# Patient Record
Sex: Male | Born: 1945 | Race: Black or African American | Hispanic: No | Smoking: Never smoker
Health system: Southern US, Community
[De-identification: ages and names within clinical notes are randomized; demographics above are authoritative.]

## PROBLEM LIST (undated history)

## (undated) DIAGNOSIS — I10 Essential (primary) hypertension: Secondary | ICD-10-CM

## (undated) DIAGNOSIS — M5126 Other intervertebral disc displacement, lumbar region: Secondary | ICD-10-CM

## (undated) DIAGNOSIS — E119 Type 2 diabetes mellitus without complications: Secondary | ICD-10-CM

## (undated) DIAGNOSIS — E7800 Pure hypercholesterolemia, unspecified: Secondary | ICD-10-CM

## (undated) DIAGNOSIS — C801 Malignant (primary) neoplasm, unspecified: Secondary | ICD-10-CM

## (undated) HISTORY — PX: COLONOSCOPY: SHX174

## (undated) HISTORY — PX: UPPER GI ENDOSCOPY: SHX6162

## (undated) HISTORY — PX: EYE SURGERY: SHX253

## (undated) HISTORY — PX: LUMBAR LAMINECTOMY: SHX95

## (undated) HISTORY — PX: COLON SURGERY: SHX602

---

## 2012-04-03 ENCOUNTER — Emergency Department: Payer: Self-pay | Admitting: *Deleted

## 2012-04-03 LAB — TROPONIN I: Troponin-I: <0.02 ng/mL

## 2012-04-03 LAB — COMPREHENSIVE METABOLIC PANEL
Albumin: 3.4 g/dL (ref 3.4–5.0)
Alkaline Phosphatase: 64 U/L (ref 50–136)
BUN: 17 mg/dL (ref 7–18)
Bilirubin,Total: 0.6 mg/dL (ref 0.2–1.0)
Creatinine: 1.04 mg/dL (ref 0.60–1.30)
EGFR (Non-African Amer.): >60
Osmolality: 291 (ref 275–301)
Potassium: 3.7 mmol/L (ref 3.5–5.1)
SGPT (ALT): 28 U/L (ref 12–78)
Total Protein: 7 g/dL (ref 6.4–8.2)

## 2012-04-03 LAB — URINALYSIS, COMPLETE
Bacteria: NONE SEEN
Bilirubin,UR: NEGATIVE
Ketone: NEGATIVE
Leukocyte Esterase: NEGATIVE
Ph: 6 (ref 4.5–8.0)
Protein: NEGATIVE
RBC,UR: 1 /HPF (ref 0–5)
Specific Gravity: 1.024 (ref 1.003–1.030)
Squamous Epithelial: NONE SEEN

## 2012-04-03 LAB — CBC
HGB: 14.3 g/dL (ref 13.0–18.0)
Platelet: 147 10*3/uL — ABNORMAL LOW (ref 150–440)
RDW: 13.4 % (ref 11.5–14.5)
WBC: 3.6 10*3/uL — ABNORMAL LOW (ref 3.8–10.6)

## 2012-04-03 LAB — CK TOTAL AND CKMB (NOT AT ARMC): CK, Total: 242 U/L — ABNORMAL HIGH (ref 35–232)

## 2012-04-15 ENCOUNTER — Emergency Department: Payer: Self-pay | Admitting: *Deleted

## 2012-05-06 ENCOUNTER — Emergency Department: Payer: Self-pay | Admitting: *Deleted

## 2012-05-06 LAB — COMPREHENSIVE METABOLIC PANEL
Albumin: 3.6 g/dL (ref 3.4–5.0)
Alkaline Phosphatase: 60 U/L (ref 50–136)
Bilirubin,Total: 0.6 mg/dL (ref 0.2–1.0)
Calcium, Total: 9 mg/dL (ref 8.5–10.1)
Co2: 31 mmol/L (ref 21–32)
EGFR (Non-African Amer.): >60
Osmolality: 279 (ref 275–301)
SGOT(AST): 24 U/L (ref 15–37)
SGPT (ALT): 32 U/L (ref 12–78)
Sodium: 139 mmol/L (ref 136–145)

## 2012-05-06 LAB — URINALYSIS, COMPLETE
Bilirubin,UR: NEGATIVE
Blood: NEGATIVE
Glucose,UR: NEGATIVE mg/dL (ref 0–75)
Nitrite: NEGATIVE
Ph: 5 (ref 4.5–8.0)
Protein: NEGATIVE
RBC,UR: 4 /HPF (ref 0–5)
WBC UR: <1 /HPF (ref 0–5)

## 2012-05-06 LAB — CBC
HGB: 13.9 g/dL (ref 13.0–18.0)
MCHC: 34.7 g/dL (ref 32.0–36.0)
Platelet: 153 10*3/uL (ref 150–440)
RBC: 4.23 10*6/uL — ABNORMAL LOW (ref 4.40–5.90)
WBC: 4.6 10*3/uL (ref 3.8–10.6)

## 2012-05-06 LAB — DRUG SCREEN, URINE
Amphetamines, Ur Screen: NEGATIVE (ref ?–1000)
Cocaine Metabolite,Ur ~~LOC~~: NEGATIVE (ref ?–300)
Methadone, Ur Screen: NEGATIVE (ref ?–300)
Opiate, Ur Screen: POSITIVE (ref ?–300)
Tricyclic, Ur Screen: NEGATIVE (ref ?–1000)

## 2012-05-06 LAB — TROPONIN I: Troponin-I: 0.03 ng/mL

## 2012-05-21 ENCOUNTER — Inpatient Hospital Stay: Payer: Self-pay | Admitting: Internal Medicine

## 2012-05-21 LAB — CBC WITH DIFFERENTIAL/PLATELET
Basophil #: 0 10*3/uL (ref 0.0–0.1)
Eosinophil #: 0.1 10*3/uL (ref 0.0–0.7)
Eosinophil %: 2.4 %
HGB: 14.2 g/dL (ref 13.0–18.0)
Lymphocyte %: 37 %
MCHC: 35.3 g/dL (ref 32.0–36.0)
Monocyte #: 0.4 x10 3/mm (ref 0.2–1.0)
Neutrophil %: 50.7 %
Platelet: 179 10*3/uL (ref 150–440)
WBC: 4.1 10*3/uL (ref 3.8–10.6)

## 2012-05-21 LAB — COMPREHENSIVE METABOLIC PANEL
Albumin: 3.9 g/dL (ref 3.4–5.0)
Anion Gap: 10 (ref 7–16)
Bilirubin,Total: 0.6 mg/dL (ref 0.2–1.0)
Chloride: 107 mmol/L (ref 98–107)
Co2: 25 mmol/L (ref 21–32)
Creatinine: 0.9 mg/dL (ref 0.60–1.30)
EGFR (African American): >60
Osmolality: 285 (ref 275–301)
Potassium: 3.7 mmol/L (ref 3.5–5.1)
SGOT(AST): 41 U/L — ABNORMAL HIGH (ref 15–37)
SGPT (ALT): 44 U/L (ref 12–78)
Sodium: 142 mmol/L (ref 136–145)
Total Protein: 7.6 g/dL (ref 6.4–8.2)

## 2012-05-21 LAB — SALICYLATE LEVEL: Salicylates, Serum: <1.7 mg/dL

## 2012-05-21 LAB — DRUG SCREEN, URINE
Amphetamines, Ur Screen: NEGATIVE (ref ?–1000)
Benzodiazepine, Ur Scrn: NEGATIVE (ref ?–200)
Cannabinoid 50 Ng, Ur ~~LOC~~: NEGATIVE (ref ?–50)
Cocaine Metabolite,Ur ~~LOC~~: NEGATIVE (ref ?–300)
MDMA (Ecstasy)Ur Screen: NEGATIVE (ref ?–500)
Methadone, Ur Screen: NEGATIVE (ref ?–300)
Opiate, Ur Screen: NEGATIVE (ref ?–300)
Phencyclidine (PCP) Ur S: NEGATIVE (ref ?–25)

## 2012-05-21 LAB — URINALYSIS, COMPLETE
Blood: NEGATIVE
Ketone: NEGATIVE
Nitrite: NEGATIVE
Protein: NEGATIVE
Specific Gravity: 1.01 (ref 1.003–1.030)
Squamous Epithelial: <1
WBC UR: 1 /HPF (ref 0–5)

## 2012-05-21 LAB — CK TOTAL AND CKMB (NOT AT ARMC)
CK, Total: 576 U/L — ABNORMAL HIGH (ref 35–232)
CK-MB: 5.7 ng/mL — ABNORMAL HIGH (ref 0.5–3.6)

## 2012-05-21 LAB — ETHANOL: Ethanol %: <0.003 % (ref 0.000–0.080)

## 2012-05-21 LAB — ACETAMINOPHEN LEVEL: Acetaminophen: <2 ug/mL

## 2012-05-21 LAB — AMMONIA: Ammonia, Plasma: 31 mcmol/L (ref 11–32)

## 2012-05-22 LAB — LIPID PANEL
Ldl Cholesterol, Calc: 93 mg/dL (ref 0–100)
VLDL Cholesterol, Calc: 20 mg/dL (ref 5–40)

## 2012-05-22 LAB — HEMOGLOBIN A1C: Hemoglobin A1C: 5.9 % (ref 4.2–6.3)

## 2012-12-04 ENCOUNTER — Emergency Department: Payer: Self-pay | Admitting: Emergency Medicine

## 2013-08-13 HISTORY — PX: LUMBAR LAMINECTOMY: SHX95

## 2014-02-09 ENCOUNTER — Ambulatory Visit: Payer: Self-pay | Admitting: Neurosurgery

## 2014-11-30 NOTE — Discharge Summary (Signed)
PATIENT NAME:  Dalton Villarreal, Dalton Villarreal MR#:  [id number] DATE OF BIRTH:  [birth year]  DATE OF ADMISSION:  05/21/2012 DATE OF DISCHARGE:  05/23/2012  DISCHARGE DIAGNOSES:  1. Metabolic encephalopathy secondary to narcotic overdose, resolved.  2. Hypertension, uncontrolled. 3. Arthritis.  4. Hypertension. 5. Diabetes.   DISCHARGE MEDICATIONS:  1. HCTZ 25 mg p.o. daily.  2. Hydralazine, which is a new medicine, 50 mg p.o. every eight hours.  3. Metoprolol 25 mg p.o. b.i.d.  4. Meloxicam 15 mg daily.  5. Omeprazole 20 mg p.o. daily.  6. Enalapril 20 milligrams daily.   DIET: Low sodium, ADA diet.   FOLLOWUP: Follow up with Dr. Tomasita Morrow in 1 to 2 weeks.   CONSULTATIONS: None.   HOSPITAL COURSE: The patient is a 69 year old male with history of hypertension, diabetes, arthritis, brought from the patient's home because of altered mental status. Please see history and physical for full details. The patient was drowsy and noted to have pinpoint pupils in the ER. The patient has been sleeping for the past three days. The patient was admitted to the hospitalist service for altered mental status secondary to narcotics. The patient received IV fluids and placed on telemetry and his Percocet was stopped. CT of the head did not show any acute changes. MRI of the brain did not show any acute changes. ABG on admission: pH 7.38, CO2 46. CBC normal. Troponin less than 0.02. Urine toxicology is negative. Salicylates 1.7. Acetaminophen 2. Ethanol level 3. EKG showed sinus rhythm with first degree AV block. The patient was admitted to the hospitalist service. He did improve very dramatically. On 10/10, he was oriented to time, place, and person when I saw him and denied any complaints except for back pain. The patient also has seen the physical therapist, did not recommend any physical therapy. For back pain, we started him on Mobic and advised him not to take any narcotics even though he says he does not take often.  He is advised to avoid that because he is getting sleepy and having altered mental status. He is supposed to see neurosurgeon for his back and advised to keep the appointment.   Hypertension, malignant. The patient's blood pressure was elevated during the hospital stay, up to 172/101 and also heart rate was around 60. The patient was started on hydralazine which he can continue at home also and given a prescription for hydralazine 50 t.i.d.   The patient was advised to continue his home medication, enalapril and metoprolol 25 mg p.o. b.i.d., HCTZ 25 mg daily. The patient can follow up with primary physician, Dr. Tomasita Morrow, in 1 to 2 weeks regarding that.   The laboratory data is significant LDL 93 and HDL 99. Troponins were negative x3.   TIME SPENT ON DISCHARGE PREPARATION: More than 30 minutes.   ____________________________ Dalton Lesches, MD sk:ap D: 05/24/2012 22:40:05 ET T: 05/25/2012 10:45:37 ET JOB#: [id number]  cc: Dalton Lesches, MD, <Dictator> Myrle Sheng. Jimmye Norman, MD Dalton Lesches MD ELECTRONICALLY SIGNED 05/31/2012 21:13

## 2014-11-30 NOTE — H&P (Signed)
PATIENT NAME:  Dalton Villarreal, Dalton Villarreal MR#:  [id number] DATE OF BIRTH:  [birth year]  DATE OF ADMISSION:  05/21/2012  PRIMARY CARE PHYSICIAN: Tomasita Morrow, MD   ER REFERRING PHYSICIAN: Pollie Friar, MD   CHIEF COMPLAINT: Decreased mental status today.   HISTORY OF PRESENT ILLNESS: The patient is a 69 year old African American male with a history of hypertension, diabetes, hyperlipidemia, was sent from clinic due to decreased mental status. The patient went to clinic today, and he looked drowsy, sleepy, and was noted to have a pinpoint pupil. He was treated with Narcan without response, so he was sent to the ED for further evaluation. Now the patient is drowsy but follows commands. He cannot provide any information. According to the patient's wife, the patient has been sleepy for the past three days. The patient's wife and daughter do not know what happened to him.    PAST MEDICAL HISTORY:  1. Hypertension.  2. Diabetes.  3. Hyperlipidemia.   PAST SURGICAL HISTORY: No surgical history according to the patient's wife.   SOCIAL HISTORY: No smoking or drinking or illicit drugs.   FAMILY HISTORY: Unknown.   REVIEW OF SYSTEMS: Unable to obtain review of systems at this time due to the patient's mental status.   ALLERGIES: No known drug allergies.    MEDICATIONS:  1. Tylenol with codeine #3,  1 tablet every 6 hours p.r.n.  2. Omeprazole 20 mg p.o. daily.  3. Lopressor 25 mg p.o. b.i.d.  4. Meloxicam 15 mg p.o. once a day. 5. HCTZ 25 mg p.o. daily.  6. Enalapril 20 mg p.o. once a day.  7. Acetaminophen 1 tablet p.o. at bedtime p.r.n. for pain.   PHYSICAL EXAMINATION:  VITALS: Temperature 97.9, blood pressure 115/83, pulse 50, respirations 16, oxygen saturation 99% on room air.   GENERAL: The patient is sleeping, drowsy, but in no acute distress. He follows commands and responds to verbal stimuli.   HEENT: Pupils are round, pinpoint. No reaction to light or accommodation. Moist oral mucosa.  Clear oropharynx. No discharge from ear or nose.   NECK: Supple. No JVD or carotid bruit. No lymphadenopathy. No thyromegaly.   CARDIOVASCULAR: S1, S2. Regular rate and rhythm, bradycardia. No murmurs or gallops.   PULMONARY: Bilateral air entry. No wheezing or rales. No use of accessory muscles to breathe.   ABDOMEN: Soft. No distention or tenderness. No organomegaly. Bowel sounds are present.   EXTREMITIES: No edema, clubbing, or cyanosis. Strong bilateral pedal pulses.   SKIN: No rash or jaundice.   NEUROLOGIC: The patient is drowsy, in no acute distress. Follows commands. No focal deficit. Power 5 out of 5.   LABORATORY, DIAGNOSTIC AND RADIOLOGICAL DATA: MRI of the brain and CAT scan of head showed no acute abnormality. ABG showed pH of 7.38, pCO2 of 46. CBC normal. Glucose 101, BUN 18, creatinine 0.90. Electrolytes are normal. CK 576, CK-MB 5.7. Troponin 0.02. Ethanol level less than 3.0. Acetaminophen less than 2.0. Urinalysis negative. Urine drug screen negative. Salicylate level less than 1.7. EKG shows sinus rhythm with first degree AV block at 60 beats per minute.   IMPRESSION:  1. Altered mental status, possibly due to medication-induced etiology.  2. Hypertension, controlled.  3. Bradycardia with first-degree AV block.  4. Diabetes.  5. Hypertension.  6. Hyperlipidemia.   PLAN OF TREATMENT:  1. The patient will be admitted to a Medical floor with telemonitor.  2. We will give normal saline IV, hold meloxicam which can cause drowsy sedation and altered mental status.  In addition, we will hold Tylenol #3 and Norco. We will hold Lopressor due to bradycardia but continue lisinopril and HCTZ.  3. GI and deep vein thrombosis prophylaxis.   I discussed the patient's situation and the plan of treatment with the patient's wife and daughter.   TIME SPENT: About 55 minutes.   ____________________________ Demetrios Loll, MD qc:cbb D: 05/21/2012 16:50:26 ET T: 05/21/2012 18:02:46  ET JOB#: [id number]  cc: Demetrios Loll, MD, <Dictator> Myrle Sheng. Jimmye Norman, MD Demetrios Loll MD ELECTRONICALLY SIGNED 05/22/2012 22:24

## 2019-04-02 ENCOUNTER — Telehealth: Payer: Self-pay | Admitting: Gastroenterology

## 2019-04-02 NOTE — Telephone Encounter (Signed)
Pt is returning your call to schedule a colonoscopy

## 2019-04-06 ENCOUNTER — Other Ambulatory Visit: Payer: Self-pay

## 2019-04-06 DIAGNOSIS — Z1211 Encounter for screening for malignant neoplasm of colon: Secondary | ICD-10-CM

## 2019-04-06 DIAGNOSIS — Z800 Family history of malignant neoplasm of digestive organs: Secondary | ICD-10-CM

## 2019-04-06 NOTE — Telephone Encounter (Signed)
Gastroenterology Pre-Procedure Review  Request Date: 04/21/19 Requesting Physician: Dr. Vicente Males  PATIENT REVIEW QUESTIONS: The patient responded to the following health history questions as indicated:    1. Are you having any GI issues? no 2. Do you have a personal history of Polyps? no 3. Do you have a family history of Colon Cancer or Polyps? yes (Brother Colon Cancer) 4. Diabetes Mellitus? yes (type 2 oral meds) 5. Joint replacements in the past 12 months?no 6. Major health problems in the past 3 months?L and R eye surgery few weeks ago 7. Any artificial heart valves, MVP, or defibrillator?no    MEDICATIONS & ALLERGIES:    Patient reports the following regarding taking any anticoagulation/antiplatelet therapy:   Plavix, Coumadin, Eliquis, Xarelto, Lovenox, Pradaxa, Brilinta, or Effient? no Aspirin? yes (81 mg daily)  Patient confirms/reports the following medications:  Current Outpatient Medications  Medication Sig Dispense Refill  . ASPIRIN LOW DOSE 81 MG EC tablet 1 BY MOUTH DAILY FOR HEART PROTECTION    . atorvastatin (LIPITOR) 40 MG tablet TAKE 1 TABLET BY MOUTH AT BEDTIME FOR HIGH CHOLESTEROL    . cyclobenzaprine (FLEXERIL) 10 MG tablet 1/2 1 BY MOUTH EVERY 8 HOURS AS NEEDED FOR MUSCLE SPASM    . DUREZOL 0.05 % EMUL APPLY 1 DROP INTO RIGHT EYE THREE TIMES A DAY AS DIRECTED    . enalapril (VASOTEC) 20 MG tablet TAKE 1 TABLET BY MOUTH TWICE A DAY FOR HIGH BLOOD PRESSURE    . gabapentin (NEURONTIN) 300 MG capsule 1 BY MOUTH 3 TIMES A DAY FOR NERVE PAIN    . hydrochlorothiazide (HYDRODIURIL) 25 MG tablet Take 25 mg by mouth daily. for high blood pressure    . ketorolac (ACULAR) 0.5 % ophthalmic solution INSTILL 1 DROP INTO RIGHT EYE 4 TIMES A DAY AS DIRECTED    . metFORMIN (GLUCOPHAGE-XR) 500 MG 24 hr tablet TAKE 1 TABLET BY MOUTH DAILY FOR DIABETES    . metoprolol tartrate (LOPRESSOR) 100 MG tablet TAKE 1 TABLET BY MOUTH TWICE DAILY FOR HIGH BLOOD PRESSURE    . potassium chloride  (KLOR-CON) 8 MEQ tablet TAKE 1 TABLET BY MOUTH DAILY FOR LOW POTASSIUM    . VIGAMOX 0.5 % ophthalmic solution INSTILL 1 DROP INTO LEFT EYE 4 TIMES A DAY AS DIRECTED     No current facility-administered medications for this visit.     Patient confirms/reports the following allergies:  Not on File  No orders of the defined types were placed in this encounter.   AUTHORIZATION INFORMATION Primary Insurance: 1D#: Group #:  Secondary Insurance: 1D#: Group #:  SCHEDULE INFORMATION: Date: 04/21/19 Time: Location:[facility]

## 2019-04-17 ENCOUNTER — Encounter: Payer: Self-pay | Admitting: *Deleted

## 2019-04-17 ENCOUNTER — Other Ambulatory Visit: Payer: Self-pay

## 2019-04-17 ENCOUNTER — Other Ambulatory Visit
Admission: RE | Admit: 2019-04-17 | Discharge: 2019-04-17 | Disposition: A | Discharge status: Home / Self Care | Payer: Medicaid Other | Source: Ambulatory Visit | Attending: Gastroenterology | Admitting: Gastroenterology

## 2019-04-17 DIAGNOSIS — Z20828 Contact with and (suspected) exposure to other viral communicable diseases: Secondary | ICD-10-CM | POA: Insufficient documentation

## 2019-04-17 DIAGNOSIS — Z01812 Encounter for preprocedural laboratory examination: Secondary | ICD-10-CM | POA: Insufficient documentation

## 2019-04-17 LAB — SARS CORONAVIRUS 2 (TAT 6-24 HRS): SARS Coronavirus 2: NEGATIVE

## 2019-04-18 ENCOUNTER — Encounter: Payer: Self-pay | Admitting: Anesthesiology

## 2019-04-21 ENCOUNTER — Ambulatory Visit: Payer: Medicaid Other | Admitting: Anesthesiology

## 2019-04-21 ENCOUNTER — Ambulatory Visit
Admission: RE | Admit: 2019-04-21 | Discharge: 2019-04-21 | Disposition: A | Discharge status: Home / Self Care | Payer: Medicaid Other | Attending: Gastroenterology | Admitting: Gastroenterology

## 2019-04-21 ENCOUNTER — Encounter
Admission: RE | Disposition: A | Discharge status: Home / Self Care | Payer: Self-pay | Source: Home / Self Care | Attending: Gastroenterology

## 2019-04-21 DIAGNOSIS — K6389 Other specified diseases of intestine: Secondary | ICD-10-CM | POA: Diagnosis not present

## 2019-04-21 DIAGNOSIS — Z79899 Other long term (current) drug therapy: Secondary | ICD-10-CM | POA: Insufficient documentation

## 2019-04-21 DIAGNOSIS — Z7984 Long term (current) use of oral hypoglycemic drugs: Secondary | ICD-10-CM | POA: Diagnosis not present

## 2019-04-21 DIAGNOSIS — E119 Type 2 diabetes mellitus without complications: Secondary | ICD-10-CM | POA: Insufficient documentation

## 2019-04-21 DIAGNOSIS — C182 Malignant neoplasm of ascending colon: Secondary | ICD-10-CM | POA: Diagnosis not present

## 2019-04-21 DIAGNOSIS — Z800 Family history of malignant neoplasm of digestive organs: Secondary | ICD-10-CM | POA: Diagnosis not present

## 2019-04-21 DIAGNOSIS — Z1211 Encounter for screening for malignant neoplasm of colon: Secondary | ICD-10-CM

## 2019-04-21 HISTORY — PX: COLONOSCOPY WITH PROPOFOL: SHX5780

## 2019-04-21 HISTORY — DX: Type 2 diabetes mellitus without complications: E11.9

## 2019-04-21 HISTORY — DX: Other intervertebral disc displacement, lumbar region: M51.26

## 2019-04-21 LAB — GLUCOSE, CAPILLARY: Glucose-Capillary: 82 mg/dL (ref 70–99)

## 2019-04-21 SURGERY — COLONOSCOPY WITH PROPOFOL
Anesthesia: General

## 2019-04-21 MED ORDER — PROPOFOL 500 MG/50ML IV EMUL
INTRAVENOUS | Status: DC | PRN
  Administered 2019-04-21: 150 ug/kg/min via INTRAVENOUS

## 2019-04-21 MED ORDER — PROPOFOL 500 MG/50ML IV EMUL
INTRAVENOUS | Status: AC
  Filled 2019-04-21: qty 50

## 2019-04-21 MED ORDER — SPOT INK MARKER SYRINGE KIT
PACK | SUBMUCOSAL | Status: DC | PRN
  Administered 2019-04-21: 5 mL via SUBMUCOSAL
  Administered 2019-04-21: 10 mL via SUBMUCOSAL

## 2019-04-21 MED ORDER — LIDOCAINE HCL (PF) 1 % IJ SOLN
INTRAMUSCULAR | Status: AC
  Filled 2019-04-21: qty 2

## 2019-04-21 MED ORDER — PROPOFOL 10 MG/ML IV BOLUS
INTRAVENOUS | Status: DC | PRN
  Administered 2019-04-21: 70 mg via INTRAVENOUS

## 2019-04-21 MED ORDER — LIDOCAINE HCL (CARDIAC) PF 100 MG/5ML IV SOSY
PREFILLED_SYRINGE | INTRAVENOUS | Status: DC | PRN
  Administered 2019-04-21: 40 mg via INTRAVENOUS

## 2019-04-21 MED ORDER — SODIUM CHLORIDE 0.9 % IV SOLN
INTRAVENOUS | Status: DC
  Administered 2019-04-21: 10:00:00 via INTRAVENOUS

## 2019-04-21 MED ORDER — LIDOCAINE HCL (PF) 2 % IJ SOLN
INTRAMUSCULAR | Status: AC
  Filled 2019-04-21: qty 10

## 2019-04-21 NOTE — Anesthesia Preprocedure Evaluation (Signed)
Anesthesia Evaluation  Patient identified by MRN, date of birth, ID band Patient awake    Reviewed: Allergy & Precautions, NPO status , Patient's Chart, lab work & pertinent test results, reviewed documented beta blocker date and time   Airway Mallampati: II  TM Distance: >3 FB     Dental  (+) Chipped   Pulmonary           Cardiovascular      Neuro/Psych    GI/Hepatic   Endo/Other  diabetes, Type 2  Renal/GU      Musculoskeletal   Abdominal   Peds  Hematology   Anesthesia Other Findings Takes B-blockers.  Reproductive/Obstetrics                             Anesthesia Physical Anesthesia Plan  ASA: III  Anesthesia Plan: General   Post-op Pain Management:    Induction: Intravenous  PONV Risk Score and Plan:   Airway Management Planned:   Additional Equipment:   Intra-op Plan:   Post-operative Plan:   Informed Consent: I have reviewed the patients History and Physical, chart, labs and discussed the procedure including the risks, benefits and alternatives for the proposed anesthesia with the patient or authorized representative who has indicated his/her understanding and acceptance.       Plan Discussed with: CRNA  Anesthesia Plan Comments:         Anesthesia Quick Evaluation

## 2019-04-21 NOTE — Anesthesia Postprocedure Evaluation (Signed)
Anesthesia Post Note  Patient: Dalton Villarreal  Procedure(s) Performed: COLONOSCOPY WITH PROPOFOL (N/A )  Patient location during evaluation: Endoscopy Anesthesia Type: General Level of consciousness: awake and alert Pain management: pain level controlled Vital Signs Assessment: post-procedure vital signs reviewed and stable Respiratory status: spontaneous breathing, nonlabored ventilation, respiratory function stable and patient connected to nasal cannula oxygen Cardiovascular status: blood pressure returned to baseline and stable Postop Assessment: no apparent nausea or vomiting Anesthetic complications: no     Last Vitals:  Vitals:   04/21/19 1102 04/21/19 1115  BP: (!) 126/99 (!) 147/98  Pulse: 66 65  Resp: 14 11  Temp:    SpO2: 100% 100%    Last Pain:  Vitals:   04/21/19 1115  TempSrc:   PainSc: 0-No pain                 Cayli Escajeda S

## 2019-04-21 NOTE — Anesthesia Procedure Notes (Signed)
Date/Time: 04/21/2019 10:26 AM Performed by: Doreen Salvage, CRNA Pre-anesthesia Checklist: Patient identified, Emergency Drugs available, Suction available and Patient being monitored Patient Re-evaluated:Patient Re-evaluated prior to induction Oxygen Delivery Method: Nasal cannula Induction Type: IV induction Dental Injury: Teeth and Oropharynx as per pre-operative assessment  Comments: Nasal cannula with etCO2 monitoring

## 2019-04-21 NOTE — Anesthesia Post-op Follow-up Note (Signed)
Anesthesia QCDR form completed.        

## 2019-04-21 NOTE — Op Note (Addendum)
[facility] Gastroenterology Patient Name: Dalton Villarreal Procedure Date: 04/21/2019 8:48 AM MRN: [id number] Account #: [id number] Date of Birth: 01-29-1946 Admit Type: Outpatient Age: 73 Room: [facility] ENDO ROOM 1 Gender: Male Note Status: Finalized THIS EXAM WAS SENT IN ERROR

## 2019-04-21 NOTE — Transfer of Care (Signed)
Immediate Anesthesia Transfer of Care Note  Patient: Dalton Villarreal  Procedure(s) Performed: Procedure(s): COLONOSCOPY WITH PROPOFOL (N/A)  Patient Location: PACU and Endoscopy Unit  Anesthesia Type:General  Level of Consciousness: sedated  Airway & Oxygen Therapy: Patient Spontanous Breathing and Patient connected to nasal cannula oxygen  Post-op Assessment: Report given to RN and Post -op Vital signs reviewed and stable  Post vital signs: Reviewed and stable  Last Vitals:  Vitals:   04/21/19 0930 04/21/19 1052  BP: (!) 160/106 117/85  Pulse: 72 69  Resp:  13  Temp: 36.7 C 36.7 C  SpO2: [id number]    Complications: No apparent anesthesia complications

## 2019-04-21 NOTE — H&P (Signed)
[name], MD [address] [place], [place], [place], [address] [address], [place], [place], [place], [address] Phone: [phone]  Fax: [phone]  Primary Care Physician:  Donnie Coffin, MD   Pre-Procedure History & Physical: HPI:  DRACE GATER is a 73 y.o. male is here for an colonoscopy.   Past Medical History:  Diagnosis Date  . Diabetes mellitus without complication ([place])     History reviewed. No pertinent surgical history.  Prior to Admission medications   Medication Sig Start Date End Date Taking? Authorizing Provider  cyclobenzaprine (FLEXERIL) 10 MG tablet 1/2 1 BY MOUTH EVERY 8 HOURS AS NEEDED FOR MUSCLE SPASM 03/23/19  Yes [provider]  DUREZOL 0.05 % EMUL APPLY 1 DROP INTO RIGHT EYE THREE TIMES A DAY AS DIRECTED 03/12/19  Yes [provider]  enalapril (VASOTEC) 20 MG tablet TAKE 1 TABLET BY MOUTH TWICE A DAY FOR HIGH BLOOD PRESSURE 03/23/19  Yes [provider]  gabapentin (NEURONTIN) 300 MG capsule 1 BY MOUTH 3 TIMES A DAY FOR NERVE PAIN 03/23/19  Yes [provider]  hydrochlorothiazide (HYDRODIURIL) 25 MG tablet Take 25 mg by mouth daily. for high blood pressure 03/23/19  Yes [provider]  ketorolac (ACULAR) 0.5 % ophthalmic solution INSTILL 1 DROP INTO RIGHT EYE 4 TIMES A DAY AS DIRECTED 03/21/19  Yes [provider]  metFORMIN (GLUCOPHAGE-XR) 500 MG 24 hr tablet TAKE 1 TABLET BY MOUTH DAILY FOR DIABETES 03/23/19  Yes [provider]  metoprolol tartrate (LOPRESSOR) 100 MG tablet TAKE 1 TABLET BY MOUTH TWICE DAILY FOR HIGH BLOOD PRESSURE 03/23/19  Yes [provider]  potassium chloride (KLOR-CON) 8 MEQ tablet TAKE 1 TABLET BY MOUTH DAILY FOR LOW POTASSIUM 03/23/19  Yes [provider]  VIGAMOX 0.5 % ophthalmic solution INSTILL 1 DROP INTO LEFT EYE 4 TIMES A DAY AS DIRECTED 03/02/19  Yes [provider]  ASPIRIN LOW DOSE 81 MG EC tablet 1 BY MOUTH DAILY FOR HEART  PROTECTION 03/23/19   [provider]  atorvastatin (LIPITOR) 40 MG tablet TAKE 1 TABLET BY MOUTH AT BEDTIME FOR HIGH CHOLESTEROL 03/23/19   [provider]    Allergies as of 04/06/2019  . (Not on File)    History reviewed. No pertinent family history.  Social History   Socioeconomic History  . Marital status: Legally Separated    Spouse name: Not on file  . Number of children: Not on file  . Years of education: Not on file  . Highest education level: Not on file  Occupational History  . Not on file  Social Needs  . Financial resource strain: Not on file  . Food insecurity    Worry: Not on file    Inability: Not on file  . Transportation needs    Medical: Not on file    Non-medical: Not on file  Tobacco Use  . Smoking status: Not on file  Substance and Sexual Activity  . Alcohol use: Never    Frequency: Never  . Drug use: Never  . Sexual activity: Not on file  Lifestyle  . Physical activity    Days per week: Not on file    Minutes per session: Not on file  . Stress: Not on file  Relationships  . Social Herbalist on phone: Not on file    Gets together: Not on file    Attends religious service: Not on file    Active member  of club or organization: Not on file    Attends meetings of clubs or organizations: Not on file    Relationship status: Not on file  . Intimate partner violence    Fear of current or ex partner: Not on file    Emotionally abused: Not on file    Physically abused: Not on file    Forced sexual activity: Not on file  Other Topics Concern  . Not on file  Social History Narrative  . Not on file    Review of Systems: See HPI, otherwise negative ROS  Physical Exam: There were no vitals taken for this visit. General:   Alert,  pleasant and cooperative in NAD Head:  Normocephalic and atraumatic. Neck:  Supple; no masses or thyromegaly. Lungs:  Clear throughout to auscultation, normal respiratory effort.    Heart:   +S1, +S2, Regular rate and rhythm, No edema. Abdomen:  Soft, nontender and nondistended. Normal bowel sounds, without guarding, and without rebound.   Neurologic:  Alert and  oriented x4;  grossly normal neurologically.  Impression/Plan: MAXUM KNOBEL is here for an colonoscopy to be performed for family history of colon cancer. Risks, benefits, limitations, and alternatives regarding  colonoscopy have been reviewed with the patient.  Questions have been answered.  All parties agreeable.   [name], MD  04/21/2019, 9:24 AM

## 2019-04-22 ENCOUNTER — Telehealth: Payer: Self-pay

## 2019-04-22 ENCOUNTER — Encounter: Payer: Self-pay | Admitting: Gastroenterology

## 2019-04-22 ENCOUNTER — Other Ambulatory Visit: Payer: Self-pay | Admitting: Anatomic Pathology & Clinical Pathology

## 2019-04-22 LAB — SURGICAL PATHOLOGY

## 2019-04-22 NOTE — Telephone Encounter (Signed)
Called pt to offer an office visit with Dr. Vicente Males to discuss colonoscopy biopsy results.  Unable to contact, VM not set up

## 2019-04-22 NOTE — Op Note (Signed)
[facility] Gastroenterology Patient Name: Dalton Villarreal Procedure Date: 04/21/2019 9:25 AM MRN: [id number] Account #: [id number] Date of Birth: [birth year] Admit Type: Outpatient Age: 73 Room: endo 1 Gender: Male Note Status: Finalized Procedure:            Colonoscopy Indications:          Screening in patient at increased risk: Family history                        of 1st-degree relative with colorectal cancer Providers:            [name] MD, MD Referring MD:         Edmonia Lynch. Aycock MD (Referring MD) Medicines:            Monitored Anesthesia Care Complications:        No immediate complications. Procedure:            Pre-Anesthesia Assessment:                       - Prior to the procedure, a History and Physical was                        performed, and patient medications and allergies were                        reviewed. The patient's tolerance of previous                        anesthesia was also reviewed. The risks and benefits of                        the procedure and the sedation options and risks were                        discussed with the patient. All questions were                        answered, and informed consent was obtained.                        [Anticoagulant Agents] [id number] Prior to Gem. [ASA                        Grade]. After reviewing the risks and benefits, the                        patient was deemed in satisfactory condition to undergo                        the procedure.                       - Prior to the procedure, a History and Physical was                        performed, and patient medications, allergies and                        sensitivities were reviewed. The patient's tolerance of  previous anesthesia was reviewed.                       - The risks and benefits of the procedure and the                        sedation options and risks were discussed with the    patient. All questions were answered and informed                        consent was obtained.                       - ASA Grade Assessment: II - A patient with mild                        systemic disease.                       After obtaining informed consent, the colonoscope was                        passed under direct vision. Throughout the procedure,                        the patient's blood pressure, pulse, and oxygen                        saturations were monitored continuously. The quality of                        the bowel preparation was [Prep Quality]. The                        Colonoscope was introduced through the anus and                        advanced to the the cecum, identified by the                        appendiceal orifice. Findings:      A fungating non-obstructing large mass was found in the proximal       ascending colon. The mass was non-circumferential. The mass measured two       cm in length. In addition, its diameter measured twenty-five mm. No       bleeding was present. Biopsies were taken with a cold forceps for       histology. Area was tattooed with an injection of Spot (carbon black).       placed at the proximal and distal aspects of teh polyp in 3 quadrants       each site      The exam was otherwise without abnormality on direct and retroflexion       views. Impression:           - Rule out malignancy, tumor in the proximal ascending                        colon. Biopsied. Tattooed.                       - The examination was otherwise  normal on direct and                        retroflexion views. Recommendation:       - Repeat colonoscopy [day] [reason].                       - Discharge patient to home (with escort).                       - Resume previous diet.                       - Continue present medications.                       - Await pathology results.                       - Repeat colonoscopy for surveillance based on                         pathology results.                       - Return to GI office in 2 weeks. Procedure Code(s):    --- Professional ---                       [phone], Colonoscopy, flexible; with directed submucosal                        injection(s), any substance                       [id number], Colonoscopy, flexible; with biopsy, single or                        multiple Diagnosis Code(s):    --- Professional ---                       Z80.0, Family history of malignant neoplasm of                        digestive organs                       D49.0, Neoplasm of unspecified behavior of digestive                        system CPT copyright 2019 [employer] Association. All rights reserved. The codes documented in this report are preliminary and upon coder review may  be revised to meet current compliance requirements. [name], MD [name] MD, MD 04/21/2019 10:55:32 AM This report has been signed electronically. Number of Addenda: 0 Note Initiated On: 04/21/2019 9:25 AM Scope Withdrawal Time: 0 hours 18 minutes 46 seconds  Total Procedure Duration: 0 hours 22 minutes 52 seconds  Estimated Blood Loss: Estimated blood loss: none.      [facility]

## 2019-04-22 NOTE — Telephone Encounter (Signed)
Spoke with pt and was able to schedule the office visit.

## 2019-04-23 ENCOUNTER — Other Ambulatory Visit: Payer: Self-pay

## 2019-04-23 ENCOUNTER — Ambulatory Visit (INDEPENDENT_AMBULATORY_CARE_PROVIDER_SITE_OTHER): Payer: Medicaid Other | Admitting: Gastroenterology

## 2019-04-23 VITALS — BP 129/85 | HR 76 | Temp 98.0°F | Ht 64.0 in | Wt 179.8 lb

## 2019-04-23 DIAGNOSIS — C182 Malignant neoplasm of ascending colon: Secondary | ICD-10-CM

## 2019-04-23 NOTE — Progress Notes (Signed)
[name] MD, MRCP(U.K) [place]  [place], [place] [address]  Main: [phone]  Fax: [phone]   Gastroenterology Consultation  Referring Provider:     Donnie Coffin, MD Primary Care Physician:  Donnie Coffin, MD Primary Gastroenterologist:  Dr. [name]  Reason for Consultation:     Discuss results of recent colonoscopy.        HPI:   Dalton Villarreal is a 73 y.o. y/o male underwent a recent screening colonoscopy on 04/21/2019 by myself. A fungating nonobstructing large mass was found in the proximal ascending colon.  About 2 cm in length and may be 25 mm in diameter.  It felt firm tattoos were placed at the proximal and distal aspect of the mass in 3 quadrant method. 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  He is here to discuss the results today.  States his last colonoscopy was back in 2012 which was normal.  Past Medical History:  Diagnosis Date  . Diabetes mellitus without complication ([place])   . Ruptured lumbar disc     Past Surgical History:  Procedure Laterality Date  . COLONOSCOPY    . COLONOSCOPY WITH PROPOFOL N/A 04/21/2019   Procedure: COLONOSCOPY WITH PROPOFOL;  Surgeon: [name], MD;  Location: [facility] ENDOSCOPY;  Service: Gastroenterology;  Laterality: N/A;  . EYE SURGERY     cataracts bilaterally  . LUMBAR LAMINECTOMY    . UPPER GI ENDOSCOPY      Prior to Admission medications   Medication Sig Start Date End Date Taking? Authorizing Provider  ASPIRIN LOW DOSE 81 MG EC tablet 1 BY MOUTH DAILY FOR HEART PROTECTION 03/23/19   [provider]  atorvastatin (LIPITOR) 40 MG tablet TAKE 1 TABLET BY MOUTH AT BEDTIME FOR HIGH CHOLESTEROL 03/23/19   [provider]  cyclobenzaprine (FLEXERIL) 10 MG tablet 1/2 1 BY MOUTH EVERY 8 HOURS AS NEEDED FOR MUSCLE SPASM 03/23/19   [provider]  DUREZOL 0.05 % EMUL APPLY 1 DROP INTO RIGHT EYE THREE TIMES A DAY AS DIRECTED 03/12/19    [provider]  enalapril (VASOTEC) 20 MG tablet TAKE 1 TABLET BY MOUTH TWICE A DAY FOR HIGH BLOOD PRESSURE 03/23/19   [provider]  gabapentin (NEURONTIN) 300 MG capsule 1 BY MOUTH 3 TIMES A DAY FOR NERVE PAIN 03/23/19   [provider]  hydrochlorothiazide (HYDRODIURIL) 25 MG tablet Take 25 mg by mouth daily. for high blood pressure 03/23/19   [provider]  ketorolac (ACULAR) 0.5 % ophthalmic solution INSTILL 1 DROP INTO RIGHT EYE 4 TIMES A DAY AS DIRECTED 03/21/19   [provider]  metFORMIN (GLUCOPHAGE-XR) 500 MG 24 hr tablet TAKE 1 TABLET BY MOUTH DAILY FOR DIABETES 03/23/19   [provider]  metoprolol tartrate (LOPRESSOR) 100 MG tablet TAKE 1 TABLET BY MOUTH TWICE DAILY FOR HIGH BLOOD PRESSURE 03/23/19   [provider]  potassium chloride (KLOR-CON) 8 MEQ tablet TAKE 1 TABLET BY MOUTH DAILY FOR LOW POTASSIUM 03/23/19   [provider]  VIGAMOX 0.5 % ophthalmic solution INSTILL 1 DROP INTO LEFT EYE 4 TIMES A DAY AS DIRECTED 03/02/19   [provider]    No family history on file.   Social History   Tobacco Use  . Smoking status: Never Smoker  . Smokeless tobacco: Never Used  Substance Use Topics  . Alcohol use: Never    Frequency: Never  . Drug use: Never    Allergies as of 04/23/2019  . (  Not on File)    Review of Systems:    All systems reviewed and negative except where noted in HPI.   Physical Exam:  There were no vitals taken for this visit. No LMP for male patient. Psych:  Alert and cooperative. Normal mood and affect. General:   Alert,  Well-developed, well-nourished, pleasant and cooperative in NAD Head:  Normocephalic and atraumatic. Eyes:  Sclera clear, no icterus.   Conjunctiva pink. Ears:  Normal auditory acuity. Nose:  No deformity, discharge, or lesions. Mouth:  No deformity or lesions,oropharynx pink & moist. Neck:  Supple; no masses or thyromegaly. Lungs:  Respirations even  and unlabored.  Clear throughout to auscultation.   No wheezes, crackles, or rhonchi. No acute distress. Heart:  Regular rate and rhythm; no murmurs, clicks, rubs, or gallops. Abdomen:  Normal bowel sounds.  No bruits.  Soft, non-tender and non-distended without masses, hepatosplenomegaly or hernias noted.  No guarding or rebound tenderness.    Neurologic:  Alert and oriented x3;  grossly normal neurologically. Skin:  Intact without significant lesions or rashes. No jaundice. Lymph Nodes:  No significant cervical adenopathy. Psych:  Alert and cooperative. Normal mood and affect.  Imaging Studies: No results found.  Assessment and Plan:   Dalton Villarreal is a 73 y.o. y/o male underwent a screening colonoscopy 2 days back.  I found a large mass in this proximal ascending colon which appeared endoscopically malignant.  I took some biopsies which demonstrated invasive adenocarcinoma. .  Site of the lesion has been tattooed in the proximal and distal aspects to facilitate surgery if needed.  I will proceed by checking a CEA, CT scan of the chest abdomen and pelvis to stage the cancer followed by referral to Dr. Tasia Catchings and Dr. Dahlia Byes.  He was accompanied by his daughter and I explained to her since she is over the age of 73 she would also require a screening colonoscopy.  Follow up as needed  Dr [name] MD,MRCP(U.K)

## 2019-04-24 ENCOUNTER — Encounter: Payer: Self-pay | Admitting: Gastroenterology

## 2019-04-24 LAB — CEA: CEA: 3.7 ng/mL (ref 0.0–4.7)

## 2019-04-24 NOTE — Progress Notes (Signed)
Inform CEA is normal

## 2019-04-27 ENCOUNTER — Telehealth: Payer: Self-pay

## 2019-04-27 NOTE — Telephone Encounter (Signed)
Spoke with pt he agrees to try the Pepcid.

## 2019-04-27 NOTE — Telephone Encounter (Signed)
Can try OTC pepcid

## 2019-04-27 NOTE — Telephone Encounter (Signed)
Pt called to request medication for acid reflux. Pt states he began experiencing acid reflux after prepping for his recent colonoscopy. I explained that I will inform Dr. Vicente Males of his request.

## 2019-04-28 NOTE — Addendum Note (Signed)
Addended by: Dorethea Clan on: 04/28/2019 01:06 PM   Modules accepted: Orders

## 2019-04-30 ENCOUNTER — Other Ambulatory Visit: Payer: Medicaid Other

## 2019-04-30 ENCOUNTER — Other Ambulatory Visit: Payer: Self-pay

## 2019-04-30 NOTE — Progress Notes (Signed)
Tumor Board Documentation  Dalton Villarreal was presented by Verlon Au, RN at our Tumor Board on 04/30/2019, which included representatives from medical oncology, radiation oncology, pathology, radiology, surgical, navigation, internal medicine, pharmacy, palliative care, research.  Dalton Villarreal currently presents as a new patient, for [place], for new positive pathology with history of the following treatments: active survellience, surgical intervention(s).  Additionally, we reviewed previous medical and familial history, history of present illness, and recent lab results along with all available histopathologic and imaging studies. The tumor board considered available treatment options and made the following recommendations: Surgery, Additional screening Seeing Dr Tasia Catchings 9/18, Dr Dahlia Byes 9/21 and having a CT next week  The following procedures/referrals were also placed: No orders of the defined types were placed in this encounter.   Clinical Trial Status: not discussed   Staging used: Pathologic Stage  AJCC Staging:       Group: Colon Cancer   National site-specific guidelines   were discussed with respect to the case.  Tumor board is a meeting of clinicians from various specialty areas who evaluate and discuss patients for whom a multidisciplinary approach is being considered. Final determinations in the plan of care are those of the provider(s). The responsibility for follow up of recommendations given during tumor board is that of the provider.   Today's extended care, comprehensive team conference, Dalton Villarreal was not present for the discussion and was not examined.   Multidisciplinary Tumor Board is a multidisciplinary case peer review process.  Decisions discussed in the Multidisciplinary Tumor Board reflect the opinions of the specialists present at the conference without having examined the patient.  Ultimately, treatment and diagnostic decisions rest with the primary provider(s) and the  patient.

## 2019-05-01 ENCOUNTER — Other Ambulatory Visit: Payer: Self-pay

## 2019-05-01 ENCOUNTER — Other Ambulatory Visit
Admission: RE | Admit: 2019-05-01 | Discharge: 2019-05-01 | Disposition: A | Discharge status: Home / Self Care | Payer: Medicaid Other | Source: Ambulatory Visit | Attending: Gastroenterology | Admitting: Gastroenterology

## 2019-05-01 ENCOUNTER — Ambulatory Visit
Admission: RE | Admit: 2019-05-01 | Discharge: 2019-05-01 | Disposition: A | Discharge status: Home / Self Care | Payer: Medicaid Other | Source: Ambulatory Visit | Attending: Gastroenterology | Admitting: Gastroenterology

## 2019-05-01 ENCOUNTER — Inpatient Hospital Stay: Payer: Medicaid Other | Attending: Oncology | Admitting: Oncology

## 2019-05-01 ENCOUNTER — Inpatient Hospital Stay: Payer: Medicaid Other

## 2019-05-01 ENCOUNTER — Encounter: Payer: Self-pay | Admitting: Oncology

## 2019-05-01 VITALS — BP 130/84 | HR 60 | Temp 98.3°F | Ht 64.0 in | Wt 180.0 lb

## 2019-05-01 DIAGNOSIS — Z803 Family history of malignant neoplasm of breast: Secondary | ICD-10-CM | POA: Diagnosis not present

## 2019-05-01 DIAGNOSIS — I2510 Atherosclerotic heart disease of native coronary artery without angina pectoris: Secondary | ICD-10-CM

## 2019-05-01 DIAGNOSIS — C182 Malignant neoplasm of ascending colon: Secondary | ICD-10-CM | POA: Insufficient documentation

## 2019-05-01 DIAGNOSIS — I700 Atherosclerosis of aorta: Secondary | ICD-10-CM | POA: Insufficient documentation

## 2019-05-01 DIAGNOSIS — Z8042 Family history of malignant neoplasm of prostate: Secondary | ICD-10-CM | POA: Diagnosis not present

## 2019-05-01 DIAGNOSIS — Z809 Family history of malignant neoplasm, unspecified: Secondary | ICD-10-CM

## 2019-05-01 DIAGNOSIS — C189 Malignant neoplasm of colon, unspecified: Secondary | ICD-10-CM | POA: Insufficient documentation

## 2019-05-01 DIAGNOSIS — C801 Malignant (primary) neoplasm, unspecified: Secondary | ICD-10-CM

## 2019-05-01 DIAGNOSIS — Z79899 Other long term (current) drug therapy: Secondary | ICD-10-CM | POA: Insufficient documentation

## 2019-05-01 HISTORY — DX: Malignant (primary) neoplasm, unspecified: C80.1

## 2019-05-01 LAB — COMPREHENSIVE METABOLIC PANEL
ALT: 23 U/L (ref 0–44)
AST: 24 U/L (ref 15–41)
Albumin: 4 g/dL (ref 3.5–5.0)
Alkaline Phosphatase: 52 U/L (ref 38–126)
Anion gap: 7 (ref 5–15)
BUN: 13 mg/dL (ref 8–23)
CO2: 31 mmol/L (ref 22–32)
Calcium: 9.3 mg/dL (ref 8.9–10.3)
Chloride: 101 mmol/L (ref 98–111)
Creatinine, Ser: 1.15 mg/dL (ref 0.61–1.24)
GFR calc Af Amer: >60 mL/min (ref >60)
GFR calc non Af Amer: >60 mL/min (ref >60)
Glucose, Bld: 97 mg/dL (ref 70–99)
Potassium: 3.6 mmol/L (ref 3.5–5.1)
Sodium: 139 mmol/L (ref 135–145)
Total Bilirubin: 0.8 mg/dL (ref 0.3–1.2)
Total Protein: 7.2 g/dL (ref 6.5–8.1)

## 2019-05-01 LAB — CBC WITH DIFFERENTIAL/PLATELET
Abs Immature Granulocytes: 0.04 10*3/uL (ref 0.00–0.07)
Basophils Absolute: 0 10*3/uL (ref 0.0–0.1)
Basophils Relative: 1 %
Eosinophils Absolute: 0.2 10*3/uL (ref 0.0–0.5)
Eosinophils Relative: 4 %
HCT: 40.2 % (ref 39.0–52.0)
Hemoglobin: 13.9 g/dL (ref 13.0–17.0)
Immature Granulocytes: 1 %
Lymphocytes Relative: 46 %
Lymphs Abs: 1.9 10*3/uL (ref 0.7–4.0)
MCH: 32 pg (ref 26.0–34.0)
MCHC: 34.6 g/dL (ref 30.0–36.0)
MCV: 92.6 fL (ref 80.0–100.0)
Monocytes Absolute: 0.5 10*3/uL (ref 0.1–1.0)
Monocytes Relative: 11 %
Neutro Abs: 1.6 10*3/uL — ABNORMAL LOW (ref 1.7–7.7)
Neutrophils Relative %: 37 %
Platelets: 175 10*3/uL (ref 150–400)
RBC: 4.34 MIL/uL (ref 4.22–5.81)
RDW: 13 % (ref 11.5–15.5)
WBC: 4.2 10*3/uL (ref 4.0–10.5)
nRBC: 0 % (ref 0.0–0.2)

## 2019-05-01 LAB — CREATININE, SERUM
Creatinine, Ser: 1.15 mg/dL (ref 0.61–1.24)
GFR calc Af Amer: >60 mL/min (ref >60)
GFR calc non Af Amer: >60 mL/min (ref >60)

## 2019-05-01 MED ORDER — IOHEXOL 300 MG/ML  SOLN
100.0000 mL | Freq: Once | INTRAMUSCULAR | Status: AC | PRN
  Administered 2019-05-01: 100 mL via INTRAVENOUS

## 2019-05-01 NOTE — Research (Signed)
05/01/2019  Patient in to clinic for consult with Dr. Tasia Catchings, [facility] study presented to the patient. Research RN reviewed the ICF / Hipaa for [employer] version dated 02-02-20  with the patient in it's entirety with specific explanation provided regarding alternatives, risks, potential benefits of participation. Patient verbalizes understanding of all the information provided. Patient was informed his participation is strictly voluntary and he can withdraw his consent at any time without any change to his standard of care. Copy of signed ICF and Hipaa provided to patient for his records. He was informed that he will receive a [employer] gift card of $50.00 after his next lab visit with central lab draw. Patient denies having any questions, escorted to the lab waiting area per Dr. Grayland Ormond orders. Jeral Fruit, RN, BSN, OCN 1600 pm

## 2019-05-02 ENCOUNTER — Encounter: Payer: Self-pay | Admitting: Oncology

## 2019-05-02 DIAGNOSIS — C801 Malignant (primary) neoplasm, unspecified: Secondary | ICD-10-CM | POA: Insufficient documentation

## 2019-05-02 NOTE — Progress Notes (Signed)
Hematology/Oncology Consult note [facility] [phone] [phone]   Patient Care Team: Donnie Coffin, MD as PCP - General (Family Medicine) Clent Jacks, RN as Oncology Nurse Navigator  REFERRING PROVIDER: [name], MD  CHIEF COMPLAINTS/REASON FOR VISIT:  Evaluation of colon cancer  HISTORY OF PRESENTING ILLNESS:   Dalton Villarreal is a  73 y.o.  male with PMH listed below was seen in consultation at the request of  [name], MD  for evaluation of colon cancer Patient recently had a routine screening colonoscopy on 04/21/2019. 04/21/2007 colonoscopy showed a fungating nonobstructive large mass was found in the proximal ascending colon.  The mass was not circumferential.  Mass was biopsied. 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 Patient reports he is feeling well at baseline.  Denies any fever, chills, unintentional weight loss, change of bowel habit, blood in the stool, or abdominal pain.  Family history positive for brother with prostate cancer, mother passed away from breast cancer.    Review of Systems  Constitutional: Negative for appetite change, chills, fatigue, fever and unexpected weight change.  HENT:   Negative for hearing loss and voice change.   Eyes: Negative for eye problems and icterus.  Respiratory: Negative for chest tightness, cough and shortness of breath.   Cardiovascular: Negative for chest pain and leg swelling.  Gastrointestinal: Negative for abdominal distention and abdominal pain.  Endocrine: Negative for hot flashes.  Genitourinary: Negative for difficulty urinating, dysuria and frequency.   Musculoskeletal: Negative for arthralgias.  Skin: Negative for itching and rash.  Neurological: Negative for light-headedness and numbness.  Hematological: Negative for adenopathy. Does not bruise/bleed easily.  Psychiatric/Behavioral: Negative for confusion.    MEDICAL  HISTORY:  Past Medical History:  Diagnosis Date   Cancer ([place])    Diabetes mellitus without complication ([place])    Ruptured lumbar disc     SURGICAL HISTORY: Past Surgical History:  Procedure Laterality Date   COLONOSCOPY     COLONOSCOPY WITH PROPOFOL N/A 04/21/2019   Procedure: COLONOSCOPY WITH PROPOFOL;  Surgeon: [name], MD;  Location: [facility] ENDOSCOPY;  Service: Gastroenterology;  Laterality: N/A;   EYE SURGERY     cataracts bilaterally   LUMBAR LAMINECTOMY     UPPER GI ENDOSCOPY      SOCIAL HISTORY: Social History   Socioeconomic History   Marital status: Legally Separated    Spouse name: Not on file   Number of children: Not on file   Years of education: Not on file   Highest education level: Not on file  Occupational History   Not on file  Social Needs   Financial resource strain: Not on file   Food insecurity    Worry: Not on file    Inability: Not on file   Transportation needs    Medical: Not on file    Non-medical: Not on file  Tobacco Use   Smoking status: Never Smoker   Smokeless tobacco: Never Used  Substance and Sexual Activity   Alcohol use: Never    Frequency: Never   Drug use: Never   Sexual activity: Not on file  Lifestyle   Physical activity    Days per week: Not on file    Minutes per session: Not on file   Stress: Not on file  Relationships   Social connections    Talks on phone: Not on file    Gets together: Not on file    Attends religious service: Not on file  Active member of club or organization: Not on file    Attends meetings of clubs or organizations: Not on file    Relationship status: Not on file   Intimate partner violence    Fear of current or ex partner: Not on file    Emotionally abused: Not on file    Physically abused: Not on file    Forced sexual activity: Not on file  Other Topics Concern   Not on file  Social History Narrative   Not on file    FAMILY HISTORY: History reviewed.  No pertinent family history.  ALLERGIES:  has No Known Allergies.  MEDICATIONS:  Current Outpatient Medications  Medication Sig Dispense Refill   ASPIRIN LOW DOSE 81 MG EC tablet 1 BY MOUTH DAILY FOR HEART PROTECTION     atorvastatin (LIPITOR) 40 MG tablet TAKE 1 TABLET BY MOUTH AT BEDTIME FOR HIGH CHOLESTEROL     cyclobenzaprine (FLEXERIL) 10 MG tablet 1/2 1 BY MOUTH EVERY 8 HOURS AS NEEDED FOR MUSCLE SPASM     DUREZOL 0.05 % EMUL APPLY 1 DROP INTO RIGHT EYE THREE TIMES A DAY AS DIRECTED     enalapril (VASOTEC) 20 MG tablet TAKE 1 TABLET BY MOUTH TWICE A DAY FOR HIGH BLOOD PRESSURE     gabapentin (NEURONTIN) 300 MG capsule 1 BY MOUTH 3 TIMES A DAY FOR NERVE PAIN     hydrochlorothiazide (HYDRODIURIL) 25 MG tablet Take 25 mg by mouth daily. for high blood pressure     ketorolac (ACULAR) 0.5 % ophthalmic solution INSTILL 1 DROP INTO RIGHT EYE 4 TIMES A DAY AS DIRECTED     metFORMIN (GLUCOPHAGE-XR) 500 MG 24 hr tablet TAKE 1 TABLET BY MOUTH DAILY FOR DIABETES     metoprolol tartrate (LOPRESSOR) 100 MG tablet TAKE 1 TABLET BY MOUTH TWICE DAILY FOR HIGH BLOOD PRESSURE     potassium chloride (KLOR-CON) 8 MEQ tablet TAKE 1 TABLET BY MOUTH DAILY FOR LOW POTASSIUM     VIGAMOX 0.5 % ophthalmic solution INSTILL 1 DROP INTO LEFT EYE 4 TIMES A DAY AS DIRECTED     No current facility-administered medications for this visit.      PHYSICAL EXAMINATION: ECOG PERFORMANCE STATUS: 0 - Asymptomatic Vitals:   05/01/19 1452  BP: 130/84  Pulse: 60  Temp: 98.3 F (36.8 C)   Filed Weights   05/01/19 1452  Weight: 180 lb (81.6 kg)    Physical Exam Constitutional:      General: He is not in acute distress. HENT:     Head: Normocephalic and atraumatic.  Eyes:     General: No scleral icterus.    Pupils: Pupils are equal, round, and reactive to light.  Neck:     Musculoskeletal: Normal range of motion and neck supple.  Cardiovascular:     Rate and Rhythm: Normal rate and regular rhythm.      Heart sounds: Normal heart sounds.  Pulmonary:     Effort: Pulmonary effort is normal. No respiratory distress.     Breath sounds: No wheezing.  Abdominal:     General: Bowel sounds are normal. There is no distension.     Palpations: Abdomen is soft. There is no mass.     Tenderness: There is no abdominal tenderness.  Musculoskeletal: Normal range of motion.        General: No deformity.  Skin:    General: Skin is warm and dry.     Findings: No erythema or rash.  Neurological:     Mental  Status: He is alert and oriented to person, place, and time.     Cranial Nerves: No cranial nerve deficit.     Coordination: Coordination normal.  Psychiatric:        Behavior: Behavior normal.        Thought Content: Thought content normal.     LABORATORY DATA:  I have reviewed the data as listed Lab Results  Component Value Date   WBC 4.2 05/01/2019   HGB 13.9 05/01/2019   HCT 40.2 05/01/2019   MCV 92.6 05/01/2019   PLT 175 05/01/2019   Recent Labs    05/01/19 0935 05/01/19 1610  NA  --  139  K  --  3.6  CL  --  101  CO2  --  31  GLUCOSE  --  97  BUN  --  13  CREATININE 1.15 1.15  CALCIUM  --  9.3  GFRNONAA >60 >60  GFRAA >60 >60  PROT  --  7.2  ALBUMIN  --  4.0  AST  --  24  ALT  --  23  ALKPHOS  --  52  BILITOT  --  0.8   Iron/TIBC/Ferritin/ %Sat No results found for: IRON, TIBC, FERRITIN, IRONPCTSAT    RADIOGRAPHIC STUDIES: I have personally reviewed the radiological images as listed and agreed with the findings in the report.  Ct Chest W Contrast  Result Date: 05/01/2019 CLINICAL DATA:  73 year old male with history of mass noted in the proximal ascending colon on recent colonoscopy. Follow-up study. EXAM: CT CHEST, ABDOMEN, AND PELVIS WITH CONTRAST TECHNIQUE: Multidetector CT imaging of the chest, abdomen and pelvis was performed following the standard protocol during bolus administration of intravenous contrast. CONTRAST:  172mL OMNIPAQUE IOHEXOL 300 MG/ML   SOLN COMPARISON:  No priors. FINDINGS: CT CHEST FINDINGS Cardiovascular: Heart size is normal. There is no significant pericardial fluid, thickening or pericardial calcification. There is aortic atherosclerosis, as well as atherosclerosis of the great vessels of the mediastinum and the coronary arteries, including calcified atherosclerotic plaque in the left anterior descending and right coronary arteries. Mediastinum/Nodes: No pathologically enlarged mediastinal or hilar lymph nodes. Esophagus is unremarkable in appearance. No axillary lymphadenopathy. Lungs/Pleura: No suspicious appearing pulmonary nodules or masses are noted. No acute consolidative airspace disease. No pleural effusions. Mild linear scarring in the lung bases bilaterally. Musculoskeletal: There are no aggressive appearing lytic or blastic lesions noted in the visualized portions of the skeleton. CT ABDOMEN PELVIS FINDINGS Hepatobiliary: No suspicious cystic or solid hepatic lesions. No intra or extrahepatic biliary ductal dilatation. Gallbladder is normal in appearance. Pancreas: No pancreatic mass. No pancreatic ductal dilatation. No pancreatic or peripancreatic fluid collections or inflammatory changes. Spleen: Unremarkable. Adrenals/Urinary Tract: Right kidney and bilateral adrenal glands are normal in appearance. In the upper pole the left kidney there is an exophytic 4.6 cm low-attenuation lesion which does not enhance, compatible with a simple cyst. No hydroureteronephrosis. Urinary bladder is unremarkable in appearance. Stomach/Bowel: Normal appearance of the stomach. No pathologic dilatation of small bowel or colon. Circumferential thickening of the distal rectum best appreciated on axial image 111 of series 2. In addition, in the mid ascending colon (axial image 73 of series 2 and coronal image 84 of series 5) there is a 2.6 x 2.8 x 2.4 cm circumferential mass which mildly narrows the colonic lumen. Normal appendix. Vascular/Lymphatic:  Aortic atherosclerosis, without evidence of aneurysm or dissection in the abdominal or pelvic vasculature. No lymphadenopathy noted in the abdomen or pelvis. Reproductive: Prostate gland and  seminal vesicles are unremarkable in appearance. Other: No significant volume of ascites.  No pneumoperitoneum. Musculoskeletal: There are no aggressive appearing lytic or blastic lesions noted in the visualized portions of the skeleton. IMPRESSION: 1. 2.6 x 2.8 x 2.4 cm circumferential mass in the mid ascending colon, corresponding to the recently diagnosed colonic neoplasm. 2. Circumferential thickening of the distal rectum. Assuming no mass was noted in this region on recent colonoscopy, this may simply reflect a proctitis, but clinical correlation is recommended. 3. No signs of metastatic disease in the chest, abdomen or pelvis. 4. Aortic atherosclerosis, in addition to 2 vessel coronary artery disease. Assessment for potential risk factor modification, dietary therapy or pharmacologic therapy may be warranted, if clinically indicated. 5. Additional incidental findings, as above. Electronically Signed   By: Vinnie Langton M.D.   On: 05/01/2019 15:18   Ct Abdomen Pelvis W Contrast  Result Date: 05/01/2019 CLINICAL DATA:  73 year old male with history of mass noted in the proximal ascending colon on recent colonoscopy. Follow-up study. EXAM: CT CHEST, ABDOMEN, AND PELVIS WITH CONTRAST TECHNIQUE: Multidetector CT imaging of the chest, abdomen and pelvis was performed following the standard protocol during bolus administration of intravenous contrast. CONTRAST:  133mL OMNIPAQUE IOHEXOL 300 MG/ML  SOLN COMPARISON:  No priors. FINDINGS: CT CHEST FINDINGS Cardiovascular: Heart size is normal. There is no significant pericardial fluid, thickening or pericardial calcification. There is aortic atherosclerosis, as well as atherosclerosis of the great vessels of the mediastinum and the coronary arteries, including calcified  atherosclerotic plaque in the left anterior descending and right coronary arteries. Mediastinum/Nodes: No pathologically enlarged mediastinal or hilar lymph nodes. Esophagus is unremarkable in appearance. No axillary lymphadenopathy. Lungs/Pleura: No suspicious appearing pulmonary nodules or masses are noted. No acute consolidative airspace disease. No pleural effusions. Mild linear scarring in the lung bases bilaterally. Musculoskeletal: There are no aggressive appearing lytic or blastic lesions noted in the visualized portions of the skeleton. CT ABDOMEN PELVIS FINDINGS Hepatobiliary: No suspicious cystic or solid hepatic lesions. No intra or extrahepatic biliary ductal dilatation. Gallbladder is normal in appearance. Pancreas: No pancreatic mass. No pancreatic ductal dilatation. No pancreatic or peripancreatic fluid collections or inflammatory changes. Spleen: Unremarkable. Adrenals/Urinary Tract: Right kidney and bilateral adrenal glands are normal in appearance. In the upper pole the left kidney there is an exophytic 4.6 cm low-attenuation lesion which does not enhance, compatible with a simple cyst. No hydroureteronephrosis. Urinary bladder is unremarkable in appearance. Stomach/Bowel: Normal appearance of the stomach. No pathologic dilatation of small bowel or colon. Circumferential thickening of the distal rectum best appreciated on axial image 111 of series 2. In addition, in the mid ascending colon (axial image 73 of series 2 and coronal image 84 of series 5) there is a 2.6 x 2.8 x 2.4 cm circumferential mass which mildly narrows the colonic lumen. Normal appendix. Vascular/Lymphatic: Aortic atherosclerosis, without evidence of aneurysm or dissection in the abdominal or pelvic vasculature. No lymphadenopathy noted in the abdomen or pelvis. Reproductive: Prostate gland and seminal vesicles are unremarkable in appearance. Other: No significant volume of ascites.  No pneumoperitoneum. Musculoskeletal: There  are no aggressive appearing lytic or blastic lesions noted in the visualized portions of the skeleton. IMPRESSION: 1. 2.6 x 2.8 x 2.4 cm circumferential mass in the mid ascending colon, corresponding to the recently diagnosed colonic neoplasm. 2. Circumferential thickening of the distal rectum. Assuming no mass was noted in this region on recent colonoscopy, this may simply reflect a proctitis, but clinical correlation is recommended. 3. No  signs of metastatic disease in the chest, abdomen or pelvis. 4. Aortic atherosclerosis, in addition to 2 vessel coronary artery disease. Assessment for potential risk factor modification, dietary therapy or pharmacologic therapy may be warranted, if clinically indicated. 5. Additional incidental findings, as above. Electronically Signed   By: Vinnie Langton M.D.   On: 05/01/2019 15:18      ASSESSMENT & PLAN:  1. Malignant neoplasm of ascending colon ([place])   2. Family history of cancer   3. Cancer ([place])   4. Coronary artery disease involving native heart without angina pectoris, unspecified vessel or lesion type    Pathology report was discussed with patient.  Per patient's request, I called patient's wife and she was able to hear the entire encounter and also participated in discussion.  Diagnosis of colon cancer was discussed. CT chest abdomen pelvis images were independently reviewed by me and discussed with patient.  No distant metastasis.  2.6 x 2.8 x 2.4 cm circumferential mass in the mid ascending colon corresponding to the recently diagnosed colon cancer.  There is circumferential thickening of the distal rectum.  Possible local inflammation. Aortic atherosclerosis, in addition to two-vessel coronary artery disease.  We discussed about the management plan.  Recommend establish care with surgery for evaluation of surgical resection.  Final pathology staging will be determined by pathological staging. And adjuvant chemotherapy will be determined by  pathology staging. Discussed with patient about clinical trial will start for specimen collection for cancer research.  He is interested.  He will need research RN today.  Family history of cancer, mother with breast cancer and brother was diagnosed with prostate cancer. Recommend genetic testing.  Patient prefers to rediscuss this after surgery.  Check CBC and a CMP. Baseline CEA has been checked on 04/23/2019 which was 3.7.  #Radiographic evidence of CAD on CT scanning.  Discussed with patient.  Lifestyle modification discussed.  Recommend patient to continue follow-up with primary care physician for further management.   Orders Placed This Encounter  Procedures   CBC with Differential/Platelet    Standing Status:   Future    Number of Occurrences:   1    Standing Expiration Date:   04/30/2020   Comprehensive metabolic panel    Standing Status:   Future    Number of Occurrences:   1    Standing Expiration Date:   04/30/2020    All questions were answered. The patient knows to call the clinic with any problems questions or concerns.  cc [name], MD    Return of visit: Follow-up to be determined.  I will see patient 1 to 2 weeks after surgery. Thank you for this kind referral and the opportunity to participate in the care of this patient. A copy of today's note is routed to referring provider      Earlie Server, MD, PhD Hematology Oncology [facility] Pager- [phone] 05/02/2019

## 2019-05-06 ENCOUNTER — Ambulatory Visit (INDEPENDENT_AMBULATORY_CARE_PROVIDER_SITE_OTHER): Payer: Medicaid Other | Admitting: Surgery

## 2019-05-06 ENCOUNTER — Telehealth: Payer: Self-pay | Admitting: Gastroenterology

## 2019-05-06 ENCOUNTER — Encounter: Payer: Self-pay | Admitting: Surgery

## 2019-05-06 ENCOUNTER — Other Ambulatory Visit: Payer: Self-pay

## 2019-05-06 VITALS — BP 129/86 | HR 67 | Temp 97.7°F | Ht 64.0 in | Wt 184.0 lb

## 2019-05-06 DIAGNOSIS — C182 Malignant neoplasm of ascending colon: Secondary | ICD-10-CM

## 2019-05-06 DIAGNOSIS — I2510 Atherosclerotic heart disease of native coronary artery without angina pectoris: Secondary | ICD-10-CM

## 2019-05-06 MED ORDER — NEOMYCIN SULFATE 500 MG PO TABS: ORAL_TABLET | ORAL | 0 refills | Status: DC

## 2019-05-06 MED ORDER — ERYTHROMYCIN BASE 500 MG PO TABS: ORAL_TABLET | ORAL | 0 refills | Status: DC

## 2019-05-06 MED ORDER — BISACODYL 5 MG PO TBEC: DELAYED_RELEASE_TABLET | ORAL | 0 refills | Status: DC

## 2019-05-06 NOTE — Patient Instructions (Signed)
We have discussed removing a portion of your damaged colon through 4 small incisions today. We have scheduled this surgery at [facility] with Dr. Dahlia Byes. Please plan a hospital stay of 3-7 days for surgery and recovery time.  We will have you complete a bowel prep prior to your surgery. Please see information provided. You have also been given a (Blue) Pre-Care Sheet with more information regarding your particular surgery. Please review all information given.  Please call our office with any questions or concerns prior to your scheduled surgery.   Laparoscopic Colectomy Laparoscopic colectomy is surgery to remove part or all of the large intestine (colon). This procedure may be used to treat several conditions, including:  Inflammation and infection of the colon (diverticulitis).  Tumors or masses in the colon.  Inflammatory bowel disease, such as Crohn disease or ulcerative colitis. Colectomy is an option when symptoms cannot be controlled with medicines.  Bleeding from the colon that cannot be controlled by another method.  Blockage or obstruction of the colon.  Tell a health care provider about:  Any allergies you have.  All medicines you are taking, including vitamins, herbs, eye drops, creams, and over-the-counter medicines.  Any problems you or family members have had with anesthetic medicines.  Any blood disorders you have.  Any surgeries you have had.  Any medical conditions you have. What are the risks? Generally, this is a safe procedure. However, problems may occur, including:  Infection.  Bleeding.  Allergic reactions to medicines or dyes.  Damage to other structures or organs.  Leaking from where the colon was sewn together.  Future blockage of the small intestines from scar tissue. Another surgery may be needed to repair this.  Needing to convert to an open procedure. Complications such as damage to other organs or excessive bleeding may require the surgeon to  convert from a laparoscopic procedure to an open procedure. This involves making a larger incision in the abdomen.  What happens before the procedure? Staying hydrated Follow instructions from your health care provider about hydration, which may include:  Up to 2 hours before the procedure - you may continue to drink clear liquids, such as water, clear fruit juice, black coffee, and plain tea.  Eating and drinking restrictions Follow instructions from your health care provider about eating and drinking, which may include:  8 hours before the procedure - stop eating heavy meals, meals with high fiber, or foods such as meat, fried foods, or fatty foods.  6 hours before the procedure - stop eating light meals or foods, such as toast or cereal.  6 hours before the procedure - stop drinking milk or drinks that contain milk.  2 hours before the procedure - stop drinking clear liquids.  Medicines  Ask your health care provider about: ? Changing or stopping your regular medicines. This is especially important if you are taking diabetes medicines or blood thinners. ? Taking medicines such as aspirin and ibuprofen. These medicines can thin your blood. Do not take these medicines before your procedure if your health care provider instructs you not to.  You may be given antibiotic medicine to clean out bacteria from your colon. Follow the directions carefully and take the medicine at the correct time. General instructions  You may be prescribed an oral bowel prep to clean out your colon in preparation for the surgery: ? Follow instructions from your health care provider about how to do this. ? Do not eat or drink anything else after you have  started the bowel prep, unless your health care provider tells you it is safe to do so.  Do not use any products that contain nicotine or tobacco, such as cigarettes and e-cigarettes. If you need help quitting, ask your health care provider. What happens  during the procedure?  To reduce your risk of infection: ? Your health care team will wash or sanitize their hands. ? Your skin will be washed with soap.  An IV tube will be inserted into one of your veins to deliver fluid and medication.  You will be given one of the following: ? A medicine to help you relax (sedative). ? A medicine to make you fall asleep (general anesthetic).  Small monitors will be connected to your body. They will be used to check your heart, blood pressure, and oxygen level.  A breathing tube may be placed into your lungs during the procedure.  A thin, flexible tube (catheter) will be placed into your bladder to drain urine.  A tube may be placed through your nose and into your stomach to drain stomach fluids (nasogastric tube, or NG tube).  Your abdomen will be filled with air so it expands. This gives the surgeon more room to operate and makes your organs easier to see.  Several small cuts (incisions) will be made in your abdomen.  A thin, lighted tube with a tiny camera on the end (laparoscope) will be put through one of the small incisions. The camera on the laparoscope will send a picture to a computer screen in the operating room. This will give the surgeon a good view inside your abdomen.  Hollow tubes will be put through the other small incisions in your abdomen. The tools that are needed for the procedure will be put through these tubes.  Clamps or staples will be put on both ends of the diseased part of the colon.  The part of the intestine between the clamps or staples will be removed.  If possible, the ends of the healthy colon that remain will be stitched (sutured) or stapled together to allow your body to pass waste (stool).  Sometimes, the remaining colon cannot be stitched back together. If this is the case, a colostomy will be needed. If you need a colostomy: ? An opening to the outside of your body (stoma) will be made through your  abdomen. ? The end of your colon will be brought to the opening. It will be stitched to the skin. ? A bag will be attached to the opening. Stool will drain into this removable bag. ? The colostomy may be temporary or permanent.  The incisions from the colectomy will be closed with sutures or staples. The procedure may vary among health care providers and hospitals. What happens after the procedure?  Your blood pressure, heart rate, breathing rate, and blood oxygen level will be monitored until the medicines you were given have worn off.  You will receive fluids through an IV tube until your bowels start to work properly.  Once your bowels are working again, you will be given clear liquids first and then solid food as tolerated.  You will be given medicines to control your pain and nausea, if needed.  Do not drive for 24 hours if you were given a sedative. This information is not intended to replace advice given to you by your health care provider. Make sure you discuss any questions you have with your health care provider. Document Released: 10/20/2002 Document Revised: 04/30/2016 Document Reviewed:  04/30/2016 Elsevier Interactive Patient Education  2018 [employer].     Laparoscopic Colectomy, Care After This sheet gives you information about how to care for yourself after your procedure. Your health care provider may also give you more specific instructions. If you have problems or questions, contact your health care provider. What can I expect after the procedure? After your procedure, it is common to have the following:  Pain in your abdomen, especially in the incision areas. You will be given medicine to control the pain.  Tiredness. This is a normal part of the recovery process. Your energy level will return to normal over the next several weeks.  Changes in your bowel movements, such as constipation or needing to go more often. Talk with your health care provider about how  to manage this.  Follow these instructions at home: Medicines  Take over-the-counter and prescription medicines only as told by your health care provider.  Do not drive or use heavy machinery while taking prescription pain medicine.  Do not drink alcohol while taking prescription pain medicine.  If you were prescribed an antibiotic medicine, use it as told by your health care provider. Do not stop using the antibiotic even if you start to feel better. Incision care  Follow instructions from your health care provider about how to take care of your incision areas. Make sure you: ? Keep your incisions clean and dry. ? Wash your hands with soap and water before and after applying medicine to the areas, and before and after changing your bandage (dressing). If soap and water are not available, use hand sanitizer. ? Change your dressing as told by your health care provider. ? Leave stitches (sutures), skin glue, or adhesive strips in place. These skin closures may need to stay in place for 2 weeks or longer. If adhesive strip edges start to loosen and curl up, you may trim the loose edges. Do not remove adhesive strips completely unless your health care provider tells you to do that.  Do not wear tight clothing over the incisions. Tight clothing may rub and irritate the incision areas, which may cause the incisions to open.  Do not take baths, swim, or use a hot tub until your health care provider approves. Ask your health care provider if you can take showers. You may only be allowed to take sponge baths for bathing.  Check your incision area every day for signs of infection. Check for: ? More redness, swelling, or pain. ? More fluid or blood. ? Warmth. ? Pus or a bad smell. Activity  Avoid lifting anything that is heavier than 10 lb (4.5 kg) for 2 weeks or until your health care provider says it is okay.  You may resume normal activities as told by your health care provider. Ask your  health care provider what activities are safe for you.  Take rest breaks during the day as needed. Eating and drinking  Follow instructions from your health care provider about what you can eat after surgery.  To prevent or treat constipation while you are taking prescription pain medicine, your health care provider may recommend that you: ? Drink enough fluid to keep your urine clear or pale yellow. ? Take over-the-counter or prescription medicines. ? Eat foods that are high in fiber, such as fresh fruits and vegetables, whole grains, and beans. ? Limit foods that are high in fat and processed sugars, such as fried and sweet foods. General instructions  Ask your health care provider when  you will need an appointment to get your sutures or staples removed.  Keep all follow-up visits as told by your health care provider. This is important. Contact a health care provider if:  You have more redness, swelling, or pain around your incisions.  You have more fluid or blood coming from the incisions.  Your incisions feel warm to the touch.  You have pus or a bad smell coming from your incisions or your dressing.  You have a fever.  You have an incision that breaks open (edges not staying together) after sutures or staples have been removed. Get help right away if:  You develop a rash.  You have chest pain or difficulty breathing.  You have pain or swelling in your legs.  You feel light-headed or you faint.  Your abdomen swells (becomes distended).  You have nausea or vomiting.  You have blood in your stool (feces). This information is not intended to replace advice given to you by your health care provider. Make sure you discuss any questions you have with your health care provider. Document Released: 02/16/2005 Document Revised: 04/30/2016 Document Reviewed: 04/30/2016 Elsevier Interactive Patient Education  [employer].

## 2019-05-06 NOTE — Telephone Encounter (Signed)
-----   Message from Shelby Mattocks, [place] sent at 05/06/2019  9:14 AM EDT ----- Needs 2 week hospital follow up with Dr. Vicente Males

## 2019-05-06 NOTE — Telephone Encounter (Signed)
No vm to offer apt

## 2019-05-08 ENCOUNTER — Other Ambulatory Visit: Payer: Self-pay

## 2019-05-08 ENCOUNTER — Inpatient Hospital Stay: Payer: Medicaid Other

## 2019-05-08 ENCOUNTER — Encounter: Payer: Self-pay | Admitting: Surgery

## 2019-05-08 DIAGNOSIS — C189 Malignant neoplasm of colon, unspecified: Secondary | ICD-10-CM

## 2019-05-08 NOTE — H&P (View-Only) (Signed)
Patient ID: Dalton Villarreal, male   DOB: April 08, 1946, 73 y.o.   MRN: [id number]  HPI Dalton Villarreal is a 73 y.o. male seen in consultation at the request of Dr. Vicente Males for biopsy-proven invasive adenocarcinoma of the ascending colon.  Please note that he recently underwent a screening colonoscopy I have personally reviewed the images showing evidence of a mass within the ascending colon.  Biopsies show evidence of adenocarcinoma.  He consequently was staged with a CT of the chest and abdomen that I have also personally reviewed showing evidence of a sending colon mass without evidence of distant metastatic disease. He did have an normal CBC and CMP.  His CEA level was normal as well.  He is able to perform more than 4 METS of activity but he does have significant chronic back pain  HPI  Past Medical History:  Diagnosis Date  . Cancer ([place])   . Diabetes mellitus without complication ([place])   . Ruptured lumbar disc     Past Surgical History:  Procedure Laterality Date  . COLONOSCOPY    . COLONOSCOPY WITH PROPOFOL N/A 04/21/2019   Procedure: COLONOSCOPY WITH PROPOFOL;  Surgeon: [name], MD;  Location: [facility] ENDOSCOPY;  Service: Gastroenterology;  Laterality: N/A;  . EYE SURGERY Bilateral    cataracts bilaterally  . LUMBAR LAMINECTOMY    . UPPER GI ENDOSCOPY      Family History  Problem Relation Age of Onset  . Breast cancer Mother   . Prostate cancer Brother     Social History Social History   Tobacco Use  . Smoking status: Never Smoker  . Smokeless tobacco: Never Used  Substance Use Topics  . Alcohol use: Never    Frequency: Never  . Drug use: Never    No Known Allergies  Current Outpatient Medications  Medication Sig Dispense Refill  . ASPIRIN LOW DOSE 81 MG EC tablet 1 BY MOUTH DAILY FOR HEART PROTECTION    . atorvastatin (LIPITOR) 40 MG tablet TAKE 1 TABLET BY MOUTH AT BEDTIME FOR HIGH CHOLESTEROL    . cyclobenzaprine (FLEXERIL) 10 MG tablet 1/2 1 BY MOUTH EVERY 8 HOURS  AS NEEDED FOR MUSCLE SPASM    . DUREZOL 0.05 % EMUL APPLY 1 DROP INTO RIGHT EYE THREE TIMES A DAY AS DIRECTED    . enalapril (VASOTEC) 20 MG tablet TAKE 1 TABLET BY MOUTH TWICE A DAY FOR HIGH BLOOD PRESSURE    . gabapentin (NEURONTIN) 300 MG capsule 1 BY MOUTH 3 TIMES A DAY FOR NERVE PAIN    . hydrochlorothiazide (HYDRODIURIL) 25 MG tablet Take 25 mg by mouth daily. for high blood pressure    . ketorolac (ACULAR) 0.5 % ophthalmic solution INSTILL 1 DROP INTO RIGHT EYE 4 TIMES A DAY AS DIRECTED    . metFORMIN (GLUCOPHAGE-XR) 500 MG 24 hr tablet TAKE 1 TABLET BY MOUTH DAILY FOR DIABETES    . metoprolol tartrate (LOPRESSOR) 100 MG tablet TAKE 1 TABLET BY MOUTH TWICE DAILY FOR HIGH BLOOD PRESSURE    . potassium chloride (KLOR-CON) 8 MEQ tablet TAKE 1 TABLET BY MOUTH DAILY FOR LOW POTASSIUM    . VIGAMOX 0.5 % ophthalmic solution INSTILL 1 DROP INTO LEFT EYE 4 TIMES A DAY AS DIRECTED    . bisacodyl (DULCOLAX) 5 MG EC tablet Take all 4 tablets at 8 am the morning prior to your surgery. 4 tablet 0  . erythromycin base (E-MYCIN) 500 MG tablet Take 2 tablets at 8am, 2 tablets at 2pm, and 2 tablets  at 8pm the day prior to surgery. 6 tablet 0  . neomycin (MYCIFRADIN) 500 MG tablet Take 2 tablet at 8am, take 2 tablets at 2pm, and take 2 tablets at 8pm the day prior to your surgery 6 tablet 0   No current facility-administered medications for this visit.      Review of Systems Full ROS  was asked and was negative except for the information on the HPI  Physical Exam Blood pressure 129/86, pulse 67, temperature 97.7 F (36.5 C), height 5\' 4"  (1.626 m), weight 184 lb (83.5 kg), SpO2 94 %. CONSTITUTIONAL: NAD EYES: Pupils are equal, round, and reactive to light, Sclera are non-icteric. EARS, NOSE, MOUTH AND THROAT: The oropharynx is clear. The oral mucosa is pink and moist. Hearing is intact to voice. LYMPH NODES:  Lymph nodes in the neck are normal. RESPIRATORY:  Lungs are clear. There is normal  respiratory effort, with equal breath sounds bilaterally, and without pathologic use of accessory muscles. CARDIOVASCULAR: Heart is regular without murmurs, gallops, or rubs. GI: The abdomen is soft, nontender, and nondistended. There are no palpable masses. There is no hepatosplenomegaly. There are normal bowel sounds in all quadrants. GU: Rectal deferred.   MUSCULOSKELETAL: Normal muscle strength and tone. No cyanosis or edema.   SKIN: Turgor is good and there are no pathologic skin lesions or ulcers. NEUROLOGIC: Motor and sensation is grossly normal. Cranial nerves are grossly intact. PSYCH:  Oriented to person, place and time. Affect is normal.  Data Reviewed  I have personally reviewed the patient's imaging, laboratory findings and medical records.    Assessment/Plan Malignant neoplasm of the ascending colon.  Current work-up does not reveal any evidence of distant metastasis.  I have an extensive discussion with the patient regarding his disease process.  The daughter was also here with him I do recommend right colectomy.  I have an extensive discussion with the patient regarding the procedure.  Risk, benefits and possible complications including but not limited to: Bleeding, infection, ostomy and anastomotic leak re-interventions chronic pain.  I do think that he will be a good candidate for laparoscopic right colectomy. Copy sent to referring providers  Caroleen Hamman, MD FACS General Surgeon 05/08/2019, 3:57 PM

## 2019-05-08 NOTE — Research (Signed)
Patient presented today to give blood sample for participation in the [facility] study.  Patient gave blood sample and was given gift card provided by study.  I thanked patient for his participation.   Lula Olszewski Clinical oncology research assistant 05/08/2019 [phone]

## 2019-05-08 NOTE — Progress Notes (Signed)
Patient ID: Dalton Villarreal, male   DOB: 1946/01/04, 73 y.o.   MRN: [id number]  HPI Dalton Villarreal is a 73 y.o. male seen in consultation at the request of Dr. Vicente Males for biopsy-proven invasive adenocarcinoma of the ascending colon.  Please note that he recently underwent a screening colonoscopy I have personally reviewed the images showing evidence of a mass within the ascending colon.  Biopsies show evidence of adenocarcinoma.  He consequently was staged with a CT of the chest and abdomen that I have also personally reviewed showing evidence of a sending colon mass without evidence of distant metastatic disease. He did have an normal CBC and CMP.  His CEA level was normal as well.  He is able to perform more than 4 METS of activity but he does have significant chronic back pain  HPI  Past Medical History:  Diagnosis Date  . Cancer ([place])   . Diabetes mellitus without complication ([place])   . Ruptured lumbar disc     Past Surgical History:  Procedure Laterality Date  . COLONOSCOPY    . COLONOSCOPY WITH PROPOFOL N/A 04/21/2019   Procedure: COLONOSCOPY WITH PROPOFOL;  Surgeon: [name], MD;  Location: [facility] ENDOSCOPY;  Service: Gastroenterology;  Laterality: N/A;  . EYE SURGERY Bilateral    cataracts bilaterally  . LUMBAR LAMINECTOMY    . UPPER GI ENDOSCOPY      Family History  Problem Relation Age of Onset  . Breast cancer Mother   . Prostate cancer Brother     Social History Social History   Tobacco Use  . Smoking status: Never Smoker  . Smokeless tobacco: Never Used  Substance Use Topics  . Alcohol use: Never    Frequency: Never  . Drug use: Never    No Known Allergies  Current Outpatient Medications  Medication Sig Dispense Refill  . ASPIRIN LOW DOSE 81 MG EC tablet 1 BY MOUTH DAILY FOR HEART PROTECTION    . atorvastatin (LIPITOR) 40 MG tablet TAKE 1 TABLET BY MOUTH AT BEDTIME FOR HIGH CHOLESTEROL    . cyclobenzaprine (FLEXERIL) 10 MG tablet 1/2 1 BY MOUTH EVERY 8 HOURS  AS NEEDED FOR MUSCLE SPASM    . DUREZOL 0.05 % EMUL APPLY 1 DROP INTO RIGHT EYE THREE TIMES A DAY AS DIRECTED    . enalapril (VASOTEC) 20 MG tablet TAKE 1 TABLET BY MOUTH TWICE A DAY FOR HIGH BLOOD PRESSURE    . gabapentin (NEURONTIN) 300 MG capsule 1 BY MOUTH 3 TIMES A DAY FOR NERVE PAIN    . hydrochlorothiazide (HYDRODIURIL) 25 MG tablet Take 25 mg by mouth daily. for high blood pressure    . ketorolac (ACULAR) 0.5 % ophthalmic solution INSTILL 1 DROP INTO RIGHT EYE 4 TIMES A DAY AS DIRECTED    . metFORMIN (GLUCOPHAGE-XR) 500 MG 24 hr tablet TAKE 1 TABLET BY MOUTH DAILY FOR DIABETES    . metoprolol tartrate (LOPRESSOR) 100 MG tablet TAKE 1 TABLET BY MOUTH TWICE DAILY FOR HIGH BLOOD PRESSURE    . potassium chloride (KLOR-CON) 8 MEQ tablet TAKE 1 TABLET BY MOUTH DAILY FOR LOW POTASSIUM    . VIGAMOX 0.5 % ophthalmic solution INSTILL 1 DROP INTO LEFT EYE 4 TIMES A DAY AS DIRECTED    . bisacodyl (DULCOLAX) 5 MG EC tablet Take all 4 tablets at 8 am the morning prior to your surgery. 4 tablet 0  . erythromycin base (E-MYCIN) 500 MG tablet Take 2 tablets at 8am, 2 tablets at 2pm, and 2 tablets  at 8pm the day prior to surgery. 6 tablet 0  . neomycin (MYCIFRADIN) 500 MG tablet Take 2 tablet at 8am, take 2 tablets at 2pm, and take 2 tablets at 8pm the day prior to your surgery 6 tablet 0   No current facility-administered medications for this visit.      Review of Systems Full ROS  was asked and was negative except for the information on the HPI  Physical Exam Blood pressure 129/86, pulse 67, temperature 97.7 F (36.5 C), height 5\' 4"  (1.626 m), weight 184 lb (83.5 kg), SpO2 94 %. CONSTITUTIONAL: NAD EYES: Pupils are equal, round, and reactive to light, Sclera are non-icteric. EARS, NOSE, MOUTH AND THROAT: The oropharynx is clear. The oral mucosa is pink and moist. Hearing is intact to voice. LYMPH NODES:  Lymph nodes in the neck are normal. RESPIRATORY:  Lungs are clear. There is normal  respiratory effort, with equal breath sounds bilaterally, and without pathologic use of accessory muscles. CARDIOVASCULAR: Heart is regular without murmurs, gallops, or rubs. GI: The abdomen is soft, nontender, and nondistended. There are no palpable masses. There is no hepatosplenomegaly. There are normal bowel sounds in all quadrants. GU: Rectal deferred.   MUSCULOSKELETAL: Normal muscle strength and tone. No cyanosis or edema.   SKIN: Turgor is good and there are no pathologic skin lesions or ulcers. NEUROLOGIC: Motor and sensation is grossly normal. Cranial nerves are grossly intact. PSYCH:  Oriented to person, place and time. Affect is normal.  Data Reviewed  I have personally reviewed the patient's imaging, laboratory findings and medical records.    Assessment/Plan Malignant neoplasm of the ascending colon.  Current work-up does not reveal any evidence of distant metastasis.  I have an extensive discussion with the patient regarding his disease process.  The daughter was also here with him I do recommend right colectomy.  I have an extensive discussion with the patient regarding the procedure.  Risk, benefits and possible complications including but not limited to: Bleeding, infection, ostomy and anastomotic leak re-interventions chronic pain.  I do think that he will be a good candidate for laparoscopic right colectomy. Copy sent to referring providers  Caroleen Hamman, MD FACS General Surgeon 05/08/2019, 3:57 PM

## 2019-05-11 ENCOUNTER — Telehealth: Payer: Self-pay | Admitting: Surgery

## 2019-05-11 NOTE — Telephone Encounter (Signed)
Pt has been advised of pre admission date/time, Covid Testing date and Surgery date.  Surgery Date: 05/26/19 with Dr Pabon/Dr Oaks-laparoscopic right colectomy.  Preadmission Testing Date: 05/19/19 @ 8:00am-office interview-Arrive at the medical mall.  Covid Testing Date: 05/22/19 between 8-10:30am - patient advised to go to the [facility] ([place])  [employer] Video sent via [employer] [facility] and [employer].  Patient has been made aware to call [phone], between 1-3:00pm the day before surgery, to find out what time to arrive.     I have went over the miralax colon surgery prep worksheet. I have also mailed the information to the patient. Patient understands all information.

## 2019-05-19 ENCOUNTER — Encounter
Admission: RE | Admit: 2019-05-19 | Discharge: 2019-05-19 | Disposition: A | Discharge status: Home / Self Care | Payer: Medicaid Other | Source: Ambulatory Visit | Attending: Surgery | Admitting: Surgery

## 2019-05-19 ENCOUNTER — Other Ambulatory Visit: Payer: Self-pay

## 2019-05-19 DIAGNOSIS — Z01818 Encounter for other preprocedural examination: Secondary | ICD-10-CM | POA: Diagnosis present

## 2019-05-19 DIAGNOSIS — E118 Type 2 diabetes mellitus with unspecified complications: Secondary | ICD-10-CM | POA: Insufficient documentation

## 2019-05-19 DIAGNOSIS — I10 Essential (primary) hypertension: Secondary | ICD-10-CM | POA: Diagnosis not present

## 2019-05-19 DIAGNOSIS — Z01810 Encounter for preprocedural cardiovascular examination: Secondary | ICD-10-CM

## 2019-05-19 HISTORY — DX: Essential (primary) hypertension: I10

## 2019-05-19 NOTE — Patient Instructions (Signed)
Your procedure is scheduled on: May 26, 2019 TUESDAY Report to Day Surgery on the 2nd floor of the [facility]. To find out your arrival time, please call [phone] between 1PM - 3PM on: MONDAY  REMEMBER: Instructions that are not followed completely may result in serious medical risk, up to and including death; or upon the discretion of your surgeon and anesthesiologist your surgery may need to be rescheduled.  Do not eat food after midnight the night before surgery.  No gum chewing, lozengers or hard candies.  You may however, drink CLEAR liquids up to 2 hours before you are scheduled to arrive for your surgery. Do not drink anything within 2 hours of the start of your surgery.  Clear liquids include: - water   Do NOT drink anything that is not on this list.  Type 1 and Type 2 diabetics should only drink water.  No Alcohol for 24 hours before or after surgery.  No Smoking including e-cigarettes for 24 hours prior to surgery.  No chewable tobacco products for at least 6 hours prior to surgery.  No nicotine patches on the day of surgery.  On the morning of surgery brush your teeth with toothpaste and water, you may rinse your mouth with mouthwash if you wish. Do not swallow any toothpaste or mouthwash.  Notify your doctor if there is any change in your medical condition (cold, fever, infection).  Do not wear jewelry, make-up, hairpins, clips or nail polish.  Do not wear lotions, powders, or perfumes.   Do not shave 48 hours prior to surgery.   Contacts and dentures may not be worn into surgery.  Do not bring valuables to the hospital, including drivers license, insurance or credit cards.  [facility] is not responsible for any belongings or valuables.   TAKE THESE MEDICATIONS THE MORNING OF SURGERY ONLY WITH A SIP OF WATER METOPROLOL CYCLOBENZAPRINE  DO NOT TAKE HYDROCHLOROTHIAZIDE, GABAPENTIN OR ENLAPRIL DAY OF SURGERY   Use CHG Soap  as directed on  instruction sheet.  BOWEL PREP AS DIRECTED BY THE OFFICE   Stop Metformin  2 days prior to surgery. LAST DOSE Saturday May 23, 2019  Follow recommendations from Cardiologist, Pulmonologist or PCP regarding stopping Aspirin STOP May 19, 2019  Stop Anti-inflammatories (NSAIDS) such as Advil, Aleve, Ibuprofen, Motrin, Naproxen, Naprosyn and Aspirin based products such as Excedrin, Goodys Powder, BC Powder. (May take Tylenol or Acetaminophen if needed.)  Stop ANY OVER THE COUNTER supplements until after surgery. (May continue Vitamin D, Vitamin B, and multivitamin. [place]  Wear comfortable clothing (specific to your surgery type) to the hospital.  Plan for stool softeners for home use.  If you are being admitted to the hospital overnight,YOU [place]. After surgery it may be brought to your room.  If you are being discharged the day of surgery, you will not be allowed to drive home. You will need a responsible adult to drive you home and stay with you that night.   If you are taking public transportation, you will need to have a responsible adult with you. Please confirm with your physician that it is acceptable to use public transportation.   Please call [phone] if you have any questions about these instructions.

## 2019-05-22 ENCOUNTER — Other Ambulatory Visit
Admission: RE | Admit: 2019-05-22 | Discharge: 2019-05-22 | Disposition: A | Discharge status: Home / Self Care | Payer: Medicaid Other | Source: Ambulatory Visit | Attending: Surgery | Admitting: Surgery

## 2019-05-22 ENCOUNTER — Other Ambulatory Visit: Payer: Self-pay

## 2019-05-22 DIAGNOSIS — Z20828 Contact with and (suspected) exposure to other viral communicable diseases: Secondary | ICD-10-CM | POA: Insufficient documentation

## 2019-05-22 DIAGNOSIS — Z01812 Encounter for preprocedural laboratory examination: Secondary | ICD-10-CM | POA: Insufficient documentation

## 2019-05-22 LAB — SARS CORONAVIRUS 2 (TAT 6-24 HRS): SARS Coronavirus 2: NEGATIVE

## 2019-05-25 MED ORDER — SODIUM CHLORIDE 0.9 % IV SOLN
1.0000 g | INTRAVENOUS | Status: AC
  Administered 2019-05-26: 12:00:00 1 g via INTRAVENOUS
  Filled 2019-05-25: qty 1

## 2019-05-25 NOTE — Progress Notes (Signed)
Completed FMLA paperwork. Patient notified and will pick up before 5pm today.

## 2019-05-26 ENCOUNTER — Encounter: Payer: Self-pay | Admitting: *Deleted

## 2019-05-26 ENCOUNTER — Inpatient Hospital Stay: Payer: Medicare Other | Admitting: Anesthesiology

## 2019-05-26 ENCOUNTER — Encounter
Admission: RE | Disposition: A | Discharge status: Home / Self Care | Payer: Self-pay | Source: Home / Self Care | Attending: Surgery

## 2019-05-26 ENCOUNTER — Inpatient Hospital Stay
Admission: RE | Admit: 2019-05-26 | Discharge: 2019-05-29 | DRG: 331 | Disposition: A | Discharge status: Home / Self Care | Payer: Medicare Other | Attending: Surgery | Admitting: Surgery

## 2019-05-26 ENCOUNTER — Other Ambulatory Visit: Payer: Self-pay

## 2019-05-26 DIAGNOSIS — C189 Malignant neoplasm of colon, unspecified: Secondary | ICD-10-CM | POA: Diagnosis present

## 2019-05-26 DIAGNOSIS — Z23 Encounter for immunization: Secondary | ICD-10-CM | POA: Diagnosis present

## 2019-05-26 DIAGNOSIS — Z7982 Long term (current) use of aspirin: Secondary | ICD-10-CM

## 2019-05-26 DIAGNOSIS — E119 Type 2 diabetes mellitus without complications: Secondary | ICD-10-CM | POA: Diagnosis present

## 2019-05-26 DIAGNOSIS — Z7984 Long term (current) use of oral hypoglycemic drugs: Secondary | ICD-10-CM | POA: Diagnosis not present

## 2019-05-26 DIAGNOSIS — Z79899 Other long term (current) drug therapy: Secondary | ICD-10-CM | POA: Diagnosis not present

## 2019-05-26 DIAGNOSIS — C182 Malignant neoplasm of ascending colon: Secondary | ICD-10-CM | POA: Diagnosis present

## 2019-05-26 HISTORY — PX: LAPAROSCOPIC RIGHT COLECTOMY: SHX5925

## 2019-05-26 LAB — CBC
HCT: 40.7 % (ref 39.0–52.0)
Hemoglobin: 13.9 g/dL (ref 13.0–17.0)
MCH: 31.8 pg (ref 26.0–34.0)
MCHC: 34.2 g/dL (ref 30.0–36.0)
MCV: 93.1 fL (ref 80.0–100.0)
Platelets: 124 10*3/uL — ABNORMAL LOW (ref 150–400)
RBC: 4.37 MIL/uL (ref 4.22–5.81)
RDW: 13.2 % (ref 11.5–15.5)
WBC: 9.6 10*3/uL (ref 4.0–10.5)
nRBC: 0 % (ref 0.0–0.2)

## 2019-05-26 LAB — HEMOGLOBIN A1C
Hgb A1c MFr Bld: 6.1 % — ABNORMAL HIGH (ref 4.8–5.6)
Mean Plasma Glucose: 128.37 mg/dL

## 2019-05-26 LAB — GLUCOSE, CAPILLARY
Glucose-Capillary: 79 mg/dL (ref 70–99)
Glucose-Capillary: 108 mg/dL — ABNORMAL HIGH (ref 70–99)
Glucose-Capillary: 130 mg/dL — ABNORMAL HIGH (ref 70–99)

## 2019-05-26 LAB — CREATININE, SERUM
Creatinine, Ser: 1.12 mg/dL (ref 0.61–1.24)
GFR calc Af Amer: >60 mL/min (ref >60)
GFR calc non Af Amer: >60 mL/min (ref >60)

## 2019-05-26 SURGERY — COLECTOMY, RIGHT, LAPAROSCOPIC
Anesthesia: General

## 2019-05-26 MED ORDER — DEXAMETHASONE SODIUM PHOSPHATE 10 MG/ML IJ SOLN
INTRAMUSCULAR | Status: AC
  Filled 2019-05-26: qty 1

## 2019-05-26 MED ORDER — SODIUM CHLORIDE 0.9 % IV SOLN
INTRAVENOUS | Status: DC | PRN
  Administered 2019-05-26: 15:00:00 100 mL

## 2019-05-26 MED ORDER — MIDAZOLAM HCL 2 MG/2ML IJ SOLN
INTRAMUSCULAR | Status: AC
  Filled 2019-05-26: qty 2

## 2019-05-26 MED ORDER — FENTANYL CITRATE (PF) 100 MCG/2ML IJ SOLN
25.0000 ug | INTRAMUSCULAR | Status: DC | PRN
  Administered 2019-05-26 (×4): 25 ug via INTRAVENOUS

## 2019-05-26 MED ORDER — ONDANSETRON HCL 4 MG/2ML IJ SOLN: 4.0000 mg | Freq: Once | INTRAMUSCULAR | Status: DC | PRN

## 2019-05-26 MED ORDER — BUPIVACAINE LIPOSOME 1.3 % IJ SUSP: 20.0000 mL | Freq: Once | INTRAMUSCULAR | Status: DC

## 2019-05-26 MED ORDER — PROPOFOL 10 MG/ML IV BOLUS
INTRAVENOUS | Status: AC
  Filled 2019-05-26: qty 20

## 2019-05-26 MED ORDER — LIDOCAINE HCL (CARDIAC) PF 100 MG/5ML IV SOSY
PREFILLED_SYRINGE | INTRAVENOUS | Status: DC | PRN
  Administered 2019-05-26: 100 mg via INTRAVENOUS

## 2019-05-26 MED ORDER — FENTANYL CITRATE (PF) 100 MCG/2ML IJ SOLN
INTRAMUSCULAR | Status: AC
  Administered 2019-05-26: 17:00:00 25 ug via INTRAVENOUS
  Filled 2019-05-26: qty 2

## 2019-05-26 MED ORDER — CELECOXIB 200 MG PO CAPS
ORAL_CAPSULE | ORAL | Status: AC
  Administered 2019-05-26: 10:00:00 200 mg via ORAL
  Filled 2019-05-26: qty 1

## 2019-05-26 MED ORDER — PROCHLORPERAZINE MALEATE 10 MG PO TABS
10.0000 mg | ORAL_TABLET | Freq: Four times a day (QID) | ORAL | Status: DC | PRN
  Filled 2019-05-26: qty 1

## 2019-05-26 MED ORDER — SODIUM CHLORIDE 0.9 % IV SOLN
INTRAVENOUS | Status: DC
  Administered 2019-05-26: 10:00:00 via INTRAVENOUS

## 2019-05-26 MED ORDER — PROCHLORPERAZINE EDISYLATE 10 MG/2ML IJ SOLN
5.0000 mg | Freq: Four times a day (QID) | INTRAMUSCULAR | Status: DC | PRN
  Filled 2019-05-26: qty 2

## 2019-05-26 MED ORDER — ONDANSETRON HCL 4 MG/2ML IJ SOLN
INTRAMUSCULAR | Status: AC
  Filled 2019-05-26: qty 2

## 2019-05-26 MED ORDER — GABAPENTIN 300 MG PO CAPS
300.0000 mg | ORAL_CAPSULE | ORAL | Status: AC
  Administered 2019-05-26: 10:00:00 300 mg via ORAL

## 2019-05-26 MED ORDER — SEVOFLURANE IN SOLN
RESPIRATORY_TRACT | Status: AC
  Filled 2019-05-26: qty 250

## 2019-05-26 MED ORDER — ROCURONIUM BROMIDE 100 MG/10ML IV SOLN
INTRAVENOUS | Status: DC | PRN
  Administered 2019-05-26: 30 mg via INTRAVENOUS
  Administered 2019-05-26: 10 mg via INTRAVENOUS
  Administered 2019-05-26: 20 mg via INTRAVENOUS
  Administered 2019-05-26: 10 mg via INTRAVENOUS

## 2019-05-26 MED ORDER — GABAPENTIN 300 MG PO CAPS
ORAL_CAPSULE | ORAL | Status: AC
  Administered 2019-05-26: 300 mg via ORAL
  Filled 2019-05-26: qty 1

## 2019-05-26 MED ORDER — CYCLOBENZAPRINE HCL 10 MG PO TABS: 10.0000 mg | ORAL_TABLET | Freq: Three times a day (TID) | ORAL | Status: DC | PRN

## 2019-05-26 MED ORDER — FAMOTIDINE 20 MG PO TABS
ORAL_TABLET | ORAL | Status: AC
  Administered 2019-05-26: 10:00:00 20 mg via ORAL
  Filled 2019-05-26: qty 1

## 2019-05-26 MED ORDER — SUCCINYLCHOLINE CHLORIDE 20 MG/ML IJ SOLN
INTRAMUSCULAR | Status: DC | PRN
  Administered 2019-05-26: 100 mg via INTRAVENOUS

## 2019-05-26 MED ORDER — LACTATED RINGERS IV SOLN
INTRAVENOUS | Status: DC | PRN
  Administered 2019-05-26 (×2): via INTRAVENOUS

## 2019-05-26 MED ORDER — EPHEDRINE SULFATE 50 MG/ML IJ SOLN
INTRAMUSCULAR | Status: DC | PRN
  Administered 2019-05-26 (×2): 5 mg via INTRAVENOUS
  Administered 2019-05-26: 15 mg via INTRAVENOUS
  Administered 2019-05-26: 5 mg via INTRAVENOUS

## 2019-05-26 MED ORDER — MIDAZOLAM HCL 2 MG/2ML IJ SOLN
INTRAMUSCULAR | Status: DC | PRN
  Administered 2019-05-26: 2 mg via INTRAVENOUS

## 2019-05-26 MED ORDER — CHLORHEXIDINE GLUCONATE CLOTH 2 % EX PADS: 6.0000 | MEDICATED_PAD | Freq: Once | CUTANEOUS | Status: DC

## 2019-05-26 MED ORDER — PANTOPRAZOLE SODIUM 40 MG IV SOLR
40.0000 mg | Freq: Every day | INTRAVENOUS | Status: DC
  Administered 2019-05-26 – 2019-05-28 (×3): 40 mg via INTRAVENOUS
  Filled 2019-05-26 (×3): qty 40

## 2019-05-26 MED ORDER — BUPIVACAINE LIPOSOME 1.3 % IJ SUSP
INTRAMUSCULAR | Status: AC
  Filled 2019-05-26: qty 20

## 2019-05-26 MED ORDER — INSULIN ASPART 100 UNIT/ML ~~LOC~~ SOLN: 0.0000 [IU] | Freq: Every day | SUBCUTANEOUS | Status: DC

## 2019-05-26 MED ORDER — HYDROCHLOROTHIAZIDE 25 MG PO TABS
25.0000 mg | ORAL_TABLET | Freq: Every day | ORAL | Status: DC
  Administered 2019-05-27 – 2019-05-29 (×3): 25 mg via ORAL
  Filled 2019-05-26 (×3): qty 1

## 2019-05-26 MED ORDER — ONDANSETRON HCL 4 MG/2ML IJ SOLN
INTRAMUSCULAR | Status: DC | PRN
  Administered 2019-05-26: 4 mg via INTRAVENOUS

## 2019-05-26 MED ORDER — METOPROLOL TARTRATE 25 MG PO TABS
25.0000 mg | ORAL_TABLET | Freq: Two times a day (BID) | ORAL | Status: DC
  Administered 2019-05-26 – 2019-05-29 (×6): 25 mg via ORAL
  Filled 2019-05-26 (×6): qty 1

## 2019-05-26 MED ORDER — INSULIN ASPART 100 UNIT/ML ~~LOC~~ SOLN
0.0000 [IU] | Freq: Three times a day (TID) | SUBCUTANEOUS | Status: DC
  Administered 2019-05-27: 3 [IU] via SUBCUTANEOUS
  Administered 2019-05-27: 2 [IU] via SUBCUTANEOUS
  Filled 2019-05-26 (×2): qty 1

## 2019-05-26 MED ORDER — ONDANSETRON 4 MG PO TBDP: 4.0000 mg | ORAL_TABLET | Freq: Four times a day (QID) | ORAL | Status: DC | PRN

## 2019-05-26 MED ORDER — KETOROLAC TROMETHAMINE 15 MG/ML IJ SOLN
15.0000 mg | Freq: Four times a day (QID) | INTRAMUSCULAR | Status: DC
  Administered 2019-05-26 – 2019-05-29 (×9): 15 mg via INTRAVENOUS
  Filled 2019-05-26 (×9): qty 1

## 2019-05-26 MED ORDER — FENTANYL CITRATE (PF) 250 MCG/5ML IJ SOLN
INTRAMUSCULAR | Status: AC
  Filled 2019-05-26: qty 5

## 2019-05-26 MED ORDER — CELECOXIB 200 MG PO CAPS
200.0000 mg | ORAL_CAPSULE | ORAL | Status: AC
  Administered 2019-05-26: 10:00:00 200 mg via ORAL

## 2019-05-26 MED ORDER — GABAPENTIN 600 MG PO TABS
300.0000 mg | ORAL_TABLET | Freq: Three times a day (TID) | ORAL | Status: DC
  Administered 2019-05-26 – 2019-05-29 (×8): 300 mg via ORAL
  Filled 2019-05-26 (×9): qty 1

## 2019-05-26 MED ORDER — PROPOFOL 10 MG/ML IV BOLUS
INTRAVENOUS | Status: DC | PRN
  Administered 2019-05-26: 100 mg via INTRAVENOUS

## 2019-05-26 MED ORDER — PHENYLEPHRINE HCL (PRESSORS) 10 MG/ML IV SOLN
INTRAVENOUS | Status: DC | PRN
  Administered 2019-05-26: 100 ug via INTRAVENOUS

## 2019-05-26 MED ORDER — SUGAMMADEX SODIUM 200 MG/2ML IV SOLN
INTRAVENOUS | Status: DC | PRN
  Administered 2019-05-26: 180 mg via INTRAVENOUS

## 2019-05-26 MED ORDER — SODIUM CHLORIDE FLUSH 0.9 % IV SOLN
INTRAVENOUS | Status: AC
  Filled 2019-05-26: qty 50

## 2019-05-26 MED ORDER — PHENYLEPHRINE HCL-NACL 20-0.9 MG/250ML-% IV SOLN
INTRAVENOUS | Status: DC | PRN
  Administered 2019-05-26: 50 ug/min via INTRAVENOUS

## 2019-05-26 MED ORDER — SODIUM CHLORIDE 0.9 % IV SOLN
INTRAVENOUS | Status: DC
  Administered 2019-05-26 – 2019-05-27 (×2): via INTRAVENOUS

## 2019-05-26 MED ORDER — BUPIVACAINE-EPINEPHRINE (PF) 0.5% -1:200000 IJ SOLN
INTRAMUSCULAR | Status: AC
  Filled 2019-05-26: qty 30

## 2019-05-26 MED ORDER — DIPHENHYDRAMINE HCL 50 MG/ML IJ SOLN: 25.0000 mg | Freq: Four times a day (QID) | INTRAMUSCULAR | Status: DC | PRN

## 2019-05-26 MED ORDER — FENTANYL CITRATE (PF) 100 MCG/2ML IJ SOLN
INTRAMUSCULAR | Status: DC | PRN
  Administered 2019-05-26 (×2): 50 ug via INTRAVENOUS

## 2019-05-26 MED ORDER — DIPHENHYDRAMINE HCL 25 MG PO CAPS: 25.0000 mg | ORAL_CAPSULE | Freq: Four times a day (QID) | ORAL | Status: DC | PRN

## 2019-05-26 MED ORDER — ACETAMINOPHEN 500 MG PO TABS
ORAL_TABLET | ORAL | Status: AC
  Administered 2019-05-26: 1000 mg via ORAL
  Filled 2019-05-26: qty 2

## 2019-05-26 MED ORDER — DEXAMETHASONE SODIUM PHOSPHATE 10 MG/ML IJ SOLN
INTRAMUSCULAR | Status: DC | PRN
  Administered 2019-05-26: 10 mg via INTRAVENOUS

## 2019-05-26 MED ORDER — ONDANSETRON HCL 4 MG/2ML IJ SOLN: 4.0000 mg | Freq: Four times a day (QID) | INTRAMUSCULAR | Status: DC | PRN

## 2019-05-26 MED ORDER — ACETAMINOPHEN 500 MG PO TABS
1000.0000 mg | ORAL_TABLET | Freq: Four times a day (QID) | ORAL | Status: DC
  Administered 2019-05-26 – 2019-05-29 (×8): 1000 mg via ORAL
  Filled 2019-05-26 (×8): qty 2

## 2019-05-26 MED ORDER — ACETAMINOPHEN 500 MG PO TABS
1000.0000 mg | ORAL_TABLET | ORAL | Status: AC
  Administered 2019-05-26: 10:00:00 1000 mg via ORAL

## 2019-05-26 MED ORDER — FAMOTIDINE 20 MG PO TABS
20.0000 mg | ORAL_TABLET | Freq: Once | ORAL | Status: AC
  Administered 2019-05-26: 10:00:00 20 mg via ORAL

## 2019-05-26 MED ORDER — INSULIN ASPART 100 UNIT/ML ~~LOC~~ SOLN: 4.0000 [IU] | Freq: Three times a day (TID) | SUBCUTANEOUS | Status: DC

## 2019-05-26 MED ORDER — ROCURONIUM BROMIDE 50 MG/5ML IV SOLN
INTRAVENOUS | Status: AC
  Filled 2019-05-26: qty 1

## 2019-05-26 MED ORDER — OXYCODONE HCL 5 MG PO TABS: 5.0000 mg | ORAL_TABLET | ORAL | Status: DC | PRN

## 2019-05-26 MED ORDER — HEPARIN SODIUM (PORCINE) 5000 UNIT/ML IJ SOLN
5000.0000 [IU] | Freq: Three times a day (TID) | INTRAMUSCULAR | Status: DC
  Administered 2019-05-27 – 2019-05-29 (×7): 5000 [IU] via SUBCUTANEOUS
  Filled 2019-05-26 (×7): qty 1

## 2019-05-26 MED ORDER — HYDRALAZINE HCL 20 MG/ML IJ SOLN: 10.0000 mg | INTRAMUSCULAR | Status: DC | PRN

## 2019-05-26 SURGICAL SUPPLY — 56 items
BLADE SURG SZ10 CARB STEEL (BLADE) ×3 IMPLANT
CANISTER SUCT 1200ML W/VALVE (MISCELLANEOUS) ×3 IMPLANT
COVER WAND RF STERILE (DRAPES) ×3 IMPLANT
DECANTER SPIKE VIAL GLASS SM (MISCELLANEOUS) IMPLANT
DEFOGGER SCOPE WARMER CLEARIFY (MISCELLANEOUS) ×3 IMPLANT
DERMABOND ADVANCED (GAUZE/BANDAGES/DRESSINGS) ×4
DERMABOND ADVANCED .7 DNX12 (GAUZE/BANDAGES/DRESSINGS) ×2 IMPLANT
DRAPE INCISE IOBAN 66X45 STRL (DRAPES) ×3 IMPLANT
DRSG OPSITE POSTOP 4X8 (GAUZE/BANDAGES/DRESSINGS) ×3 IMPLANT
ELECT CAUTERY BLADE 6.4 (BLADE) ×3 IMPLANT
ELECT REM PT RETURN 9FT ADLT (ELECTROSURGICAL) ×3
ELECTRODE REM PT RTRN 9FT ADLT (ELECTROSURGICAL) ×1 IMPLANT
GLOVE BIO SURGEON STRL SZ7 (GLOVE) ×9 IMPLANT
GOWN STRL REUS W/ TWL LRG LVL3 (GOWN DISPOSABLE) ×4 IMPLANT
GOWN STRL REUS W/TWL LRG LVL3 (GOWN DISPOSABLE) ×8
HANDLE SUCTION POOLE (INSTRUMENTS) IMPLANT
HANDLE YANKAUER SUCT BULB TIP (MISCELLANEOUS) ×3 IMPLANT
HOLDER FOLEY CATH W/STRAP (MISCELLANEOUS) IMPLANT
NEEDLE HYPO 22GX1.5 SAFETY (NEEDLE) ×3 IMPLANT
NS IRRIG 1000ML POUR BTL (IV SOLUTION) ×3 IMPLANT
PACK COLON CLEAN CLOSURE (MISCELLANEOUS) ×3 IMPLANT
PACK LAP CHOLECYSTECTOMY (MISCELLANEOUS) ×3 IMPLANT
PENCIL ELECTRO HAND CTR (MISCELLANEOUS) ×3 IMPLANT
RELOAD PROXIMATE 75MM BLUE (ENDOMECHANICALS) ×6 IMPLANT
RELOAD STAPLER BLUE 60MM (STAPLE) IMPLANT
RELOAD STAPLER WHITE 60MM (STAPLE) IMPLANT
RETRACTOR WOUND ALXS 18CM MED (MISCELLANEOUS) IMPLANT
RTRCTR WOUND ALEXIS O 18CM MED (MISCELLANEOUS)
SHEARS HARMONIC ACE PLUS 36CM (ENDOMECHANICALS) ×3 IMPLANT
SLEEVE ENDOPATH XCEL 5M (ENDOMECHANICALS) IMPLANT
SPONGE LAP 18X18 RF (DISPOSABLE) ×9 IMPLANT
SPONGE LAP 18X36 RFD (DISPOSABLE) IMPLANT
STAPLE ECHEON FLEX 60 POW ENDO (STAPLE) IMPLANT
STAPLER PROXIMATE 75MM BLUE (STAPLE) ×3 IMPLANT
STAPLER RELOAD BLUE 60MM (STAPLE)
STAPLER RELOAD WHITE 60MM (STAPLE)
SUCTION POOLE HANDLE (INSTRUMENTS)
SUT MNCRL 4-0 (SUTURE) ×4
SUT MNCRL 4-0 27XMFL (SUTURE) ×2
SUT PDS AB 0 CT1 27 (SUTURE) ×6 IMPLANT
SUT SILK 2 0 (SUTURE) ×2
SUT SILK 2 0 SH CR/8 (SUTURE) ×3 IMPLANT
SUT SILK 2-0 30XBRD TIE 12 (SUTURE) ×1 IMPLANT
SUT VIC AB 3-0 SH 27 (SUTURE) ×2
SUT VIC AB 3-0 SH 27X BRD (SUTURE) ×1 IMPLANT
SUT VICRYL 0 AB UR-6 (SUTURE) ×6 IMPLANT
SUTURE MNCRL 4-0 27XMF (SUTURE) ×2 IMPLANT
SYR 30ML LL (SYRINGE) ×3 IMPLANT
SYS LAPSCP GELPORT 120MM (MISCELLANEOUS) ×3
SYSTEM LAPSCP GELPORT 120MM (MISCELLANEOUS) ×1 IMPLANT
TOWEL OR 17X26 4PK STRL BLUE (TOWEL DISPOSABLE) ×3 IMPLANT
TRAY FOLEY MTR SLVR 16FR STAT (SET/KITS/TRAYS/PACK) IMPLANT
TROCAR XCEL 12X100 BLDLESS (ENDOMECHANICALS) ×3 IMPLANT
TROCAR XCEL BLUNT TIP 100MML (ENDOMECHANICALS) ×3 IMPLANT
TROCAR XCEL NON-BLD 5MMX100MML (ENDOMECHANICALS) ×3 IMPLANT
TUBING EVAC SMOKE HEATED PNEUM (TUBING) ×3 IMPLANT

## 2019-05-26 NOTE — Anesthesia Procedure Notes (Signed)
Procedure Name: Intubation Date/Time: 05/26/2019 11:39 AM Performed by: Justus Memory, CRNA Pre-anesthesia Checklist: Patient identified, Patient being monitored, Timeout performed, Emergency Drugs available and Suction available Patient Re-evaluated:Patient Re-evaluated prior to induction Oxygen Delivery Method: Circle system utilized Preoxygenation: Pre-oxygenation with 100% oxygen Induction Type: IV induction Ventilation: Mask ventilation without difficulty Laryngoscope Size: Mac, McGraph and 4 Grade View: Grade I Tube type: Oral Tube size: 7.0 mm Number of attempts: 1 Airway Equipment and Method: Stylet and Video-laryngoscopy Placement Confirmation: ETT inserted through vocal cords under direct vision,  positive ETCO2 and breath sounds checked- equal and bilateral Secured at: 21 cm Tube secured with: Tape Dental Injury: Teeth and Oropharynx as per pre-operative assessment  Difficulty Due To: Difficulty was anticipated and Difficult Airway- due to large tongue Future Recommendations: Recommend- induction with short-acting agent, and alternative techniques readily available

## 2019-05-26 NOTE — Transfer of Care (Signed)
Immediate Anesthesia Transfer of Care Note  Patient: Dalton Villarreal  Procedure(s) Performed: LAPAROSCOPIC RIGHT COLECTOMY (N/A )  Patient Location: PACU  Anesthesia Type:General  Level of Consciousness: sedated  Airway & Oxygen Therapy: Patient Spontanous Breathing and Patient connected to face mask oxygen  Post-op Assessment: Report given to RN and Post -op Vital signs reviewed and stable  Post vital signs: Reviewed and stable  Last Vitals:  Vitals Value Taken Time  BP 141/78 05/26/19 1520  Temp 37.2 C 05/26/19 1520  Pulse 62 05/26/19 1523  Resp 9 05/26/19 1523  SpO2 100 % 05/26/19 1523  Vitals shown include unvalidated device data.  Last Pain:  Vitals:   05/26/19 1520  TempSrc:   PainSc: (P) Asleep         Complications: No apparent anesthesia complications

## 2019-05-26 NOTE — Op Note (Signed)
PROCEDURES: 1. Hand assisted Laparoscopic Right Hemicolectomy With stapled ileocolostomy  Pre-operative Diagnosis: Right Colon CA  Post-operative Diagnosis: Same  Surgeon: Marjory Lies Pabon   Assistants: Dr. Genevive Bi Required due to the complexity of the case: for exposure and creation of the anastomosis  Anesthesia: General endotracheal anesthesia  ASA Class: 2  Surgeon: Caroleen Hamman , MD FACS  Anesthesia: Gen. with endotracheal tube   Findings: Right colon mass, no evidence of distant metastasis Tension free anastomosis, no evidence of intraop leak and good perfusion Significant adhesions from omentum to transverse colon likely from inflammatory response.  Estimated Blood Loss: 50cc         Drains: none         Specimens: Right colon          Complications: none          Procedure Details  The patient was seen again in the Holding Room. The benefits, complications, treatment options, and expected outcomes were discussed with the patient. The risks of bleeding, infection, recurrence of symptoms, failure to resolve symptoms,  bowel injury, any of which could require further surgery were reviewed with the patient.   The patient was taken to Operating Room, identified as Dalton Villarreal and the procedure verified.  A Time Out was held and the above information confirmed.  Prior to the induction of general anesthesia, antibiotic prophylaxis was administered. VTE prophylaxis was in place. General endotracheal anesthesia was then administered and tolerated well. After the induction, the abdomen was prepped with Chloraprep and draped in the sterile fashion. The patient was positioned in the supine position.  7 cm incision was created as a midline mini laparotomy. The abdominal cavity was entered under direct visualization and the GelPort device was placed. two 5 mm ports were placed  under direct visualization and pneumoperitoneum was obtained.  The more dynamic changes were observed. The  greater omentum was divided and the hepatic flexure was taken down using harmonic scalpel.  The white line of [place] was incised and a lateral to medial dissection was performed.  We identified the right ureter as well as the duodenum and preserve both structures at all times. We Were also able to mobilize the attachments of the cecum and terminal ileum.  Once we had an adequate mobilization were able to remove the GelPort and exteriorized the right colon.  A 10 cm margin on the terminal ileum was identified and we created a window with electrocautery and divided the terminal ileum.  Attention then was turned to the distal excision margin.  We identified the middle colic artery on selected a spot right to the middle colic artery.  Were able to also use a 75 GIA stapler to divide this area.  The mesentery was scored with electrocautery.  We identified the right colic artery and suture ligated with 2 oh silks in the standard fashion.  The rest of the mesentery was divided using the harmonic scalpel.  Please note that we went as low as possible to the base of the mesentery to obtain adequate lymph nodes and adequate margins of dissection. Specimen was passed and sent to permanent pathology.  A standard side-to-side functional end to end staple anastomosis was created with multiple loads of a 75 GIA stapler device.  We check for patency as well as leak.  There was a tension-free anastomosis with good perfusion and no evidence of intraoperative leak.  . We changed gloves and place a clean closure tray.   Some of Marcaine  was injected throughout the abdominal wall on both sides under direct visualization and palpation.  The fascia was closed with a running 0 PDS using the small bite techniques.  Incisions were closed with staples and sterile dressing applied..  Needle and laparotomy counts were correct and there were no immediate complications     Caroleen Hamman, MD, FACS

## 2019-05-26 NOTE — Anesthesia Post-op Follow-up Note (Signed)
Anesthesia QCDR form completed.        

## 2019-05-26 NOTE — Anesthesia Preprocedure Evaluation (Addendum)
Anesthesia Evaluation  Patient identified by MRN, date of birth, ID band Patient awake    Reviewed: Allergy & Precautions, NPO status , Patient's Chart, lab work & pertinent test results, reviewed documented beta blocker date and time   Airway Mallampati: II  TM Distance: >3 FB     Dental  (+) Chipped, Edentulous Upper   Pulmonary neg pulmonary ROS,    Pulmonary exam normal        Cardiovascular hypertension, Pt. on medications and Pt. on home beta blockers Normal cardiovascular exam     Neuro/Psych negative psych ROS   GI/Hepatic Neg liver ROS,   Endo/Other  diabetes, Type 2  Renal/GU negative Renal ROS  negative genitourinary   Musculoskeletal  (+) Arthritis , Osteoarthritis,    Abdominal Normal abdominal exam  (+)   Peds negative pediatric ROS (+)  Hematology negative hematology ROS (+)   Anesthesia Other Findings Takes B-blockers.  Reproductive/Obstetrics                            Anesthesia Physical  Anesthesia Plan  ASA: III  Anesthesia Plan: General   Post-op Pain Management:    Induction: Intravenous  PONV Risk Score and Plan:   Airway Management Planned: Oral ETT  Additional Equipment:   Intra-op Plan:   Post-operative Plan: Extubation in OR  Informed Consent: I have reviewed the patients History and Physical, chart, labs and discussed the procedure including the risks, benefits and alternatives for the proposed anesthesia with the patient or authorized representative who has indicated his/her understanding and acceptance.     Dental advisory given  Plan Discussed with: CRNA  Anesthesia Plan Comments:         Anesthesia Quick Evaluation

## 2019-05-26 NOTE — Interval H&P Note (Signed)
History and Physical Interval Note:  05/26/2019 10:58 AM  Dalton Villarreal  has presented today for surgery, with the diagnosis of Colon Cancer.  The various methods of treatment have been discussed with the patient and family. After consideration of risks, benefits and other options for treatment, the patient has consented to  Procedure(s): LAPAROSCOPIC RIGHT COLECTOMY (N/A) as a surgical intervention.  The patient's history has been reviewed, patient examined, no change in status, stable for surgery.  I have reviewed the patient's chart and labs.  Questions were answered to the patient's satisfaction.     [place]

## 2019-05-27 ENCOUNTER — Ambulatory Visit: Payer: Medicaid Other | Admitting: Gastroenterology

## 2019-05-27 ENCOUNTER — Encounter: Payer: Self-pay | Admitting: Surgery

## 2019-05-27 LAB — CBC
HCT: 34.7 % — ABNORMAL LOW (ref 39.0–52.0)
Hemoglobin: 11.9 g/dL — ABNORMAL LOW (ref 13.0–17.0)
MCH: 31.3 pg (ref 26.0–34.0)
MCHC: 34.3 g/dL (ref 30.0–36.0)
MCV: 91.3 fL (ref 80.0–100.0)
Platelets: 133 10*3/uL — ABNORMAL LOW (ref 150–400)
RBC: 3.8 MIL/uL — ABNORMAL LOW (ref 4.22–5.81)
RDW: 12.9 % (ref 11.5–15.5)
WBC: 9.7 10*3/uL (ref 4.0–10.5)
nRBC: 0 % (ref 0.0–0.2)

## 2019-05-27 LAB — GLUCOSE, CAPILLARY
Glucose-Capillary: 122 mg/dL — ABNORMAL HIGH (ref 70–99)
Glucose-Capillary: 166 mg/dL — ABNORMAL HIGH (ref 70–99)
Glucose-Capillary: 93 mg/dL (ref 70–99)
Glucose-Capillary: 112 mg/dL — ABNORMAL HIGH (ref 70–99)

## 2019-05-27 LAB — BASIC METABOLIC PANEL
Anion gap: 9 (ref 5–15)
BUN: 18 mg/dL (ref 8–23)
CO2: 21 mmol/L — ABNORMAL LOW (ref 22–32)
Calcium: 8.2 mg/dL — ABNORMAL LOW (ref 8.9–10.3)
Chloride: 109 mmol/L (ref 98–111)
Creatinine, Ser: 1.11 mg/dL (ref 0.61–1.24)
GFR calc Af Amer: >60 mL/min (ref >60)
GFR calc non Af Amer: >60 mL/min (ref >60)
Glucose, Bld: 137 mg/dL — ABNORMAL HIGH (ref 70–99)
Potassium: 4 mmol/L (ref 3.5–5.1)
Sodium: 139 mmol/L (ref 135–145)

## 2019-05-27 LAB — MAGNESIUM: Magnesium: 2.1 mg/dL (ref 1.7–2.4)

## 2019-05-27 LAB — PHOSPHORUS: Phosphorus: 3.2 mg/dL (ref 2.5–4.6)

## 2019-05-27 MED ORDER — PNEUMOCOCCAL VAC POLYVALENT 25 MCG/0.5ML IJ INJ
0.5000 mL | INJECTION | INTRAMUSCULAR | Status: AC
  Administered 2019-05-28: 09:00:00 0.5 mL via INTRAMUSCULAR
  Filled 2019-05-27: qty 0.5

## 2019-05-27 MED ORDER — CHLORHEXIDINE GLUCONATE CLOTH 2 % EX PADS
6.0000 | MEDICATED_PAD | Freq: Every day | CUTANEOUS | Status: DC
  Administered 2019-05-28 – 2019-05-29 (×2): 6 via TOPICAL

## 2019-05-27 NOTE — Plan of Care (Signed)
No acute  this shift, Pt is A/Ox4, abdominal dressing c/d/I, denies pain, 1 BM this shift, report passing gas, foley intact with adequate UOP, safety measures in place, will continue to monitor.  Problem: Education: Goal: Knowledge of General Education information will improve Description: Including pain rating scale, medication(s)/side effects and non-pharmacologic comfort measures Outcome: Progressing   Problem: Health Behavior/Discharge Planning: Goal: Ability to manage health-related needs will improve Outcome: Progressing   Problem: Clinical Measurements: Goal: Ability to maintain clinical measurements within normal limits will improve Outcome: Progressing Goal: Will remain free from infection Outcome: Progressing Goal: Diagnostic test results will improve Outcome: Progressing Goal: Respiratory complications will improve Outcome: Progressing Goal: Cardiovascular complication will be avoided Outcome: Progressing   Problem: Activity: Goal: Risk for activity intolerance will decrease Outcome: Progressing   Problem: Nutrition: Goal: Adequate nutrition will be maintained Outcome: Progressing   Problem: Coping: Goal: Level of anxiety will decrease Outcome: Progressing   Problem: Elimination: Goal: Will not experience complications related to bowel motility Outcome: Progressing Goal: Will not experience complications related to urinary retention Outcome: Progressing   Problem: Pain Managment: Goal: General experience of comfort will improve Outcome: Progressing   Problem: Safety: Goal: Ability to remain free from injury will improve Outcome: Progressing   Problem: Skin Integrity: Goal: Risk for impaired skin integrity will decrease Outcome: Progressing

## 2019-05-27 NOTE — Progress Notes (Signed)
CC: POD# 1  Subjective: Doing well, flatus + and BM   Objective: Vital signs in last 24 hours: Temp:  [97.3 F (36.3 C)-99 F (37.2 C)] 98.4 F (36.9 C) (10/14 0619) Pulse Rate:  [58-68] 64 (10/14 0619) Resp:  [10-20] 20 (10/13 2024) BP: (120-163)/(78-108) 120/78 (10/14 0619) SpO2:  [96 %-100 %] 97 % (10/14 0619) Weight:  [83 kg] 83 kg (10/13 2024)    Intake/Output from previous day: 10/13 0701 - 10/14 0700 In: 1768.4 [I.V.:1768.4] Out: 2250 [Urine:2200; Blood:50] Intake/Output this shift: No intake/output data recorded.  Physical exam: NAD,alert Abd: soft, nt no peritonitis. Wound healing well   Lab Results: CBC  Recent Labs    05/26/19 1916 05/27/19 0635  WBC 9.6 9.7  HGB 13.9 11.9*  HCT 40.7 34.7*  PLT 124* 133*   BMET Recent Labs    05/26/19 1916 05/27/19 0635  NA  --  139  K  --  4.0  CL  --  109  CO2  --  21*  GLUCOSE  --  137*  BUN  --  18  CREATININE 1.12 1.11  CALCIUM  --  8.2*   PT/INR No results for input(s): LABPROT, INR in the last 72 hours. ABG No results for input(s): PHART, HCO3 in the last 72 hours.  Invalid input(s): PCO2, PO2  Studies/Results: No results found.  Anti-infectives: Anti-infectives (From admission, onward)   Start     Dose/Rate Route Frequency Ordered Stop   05/26/19 0600  ertapenem (INVANZ) 1,000 mg in sodium chloride 0.9 % 100 mL IVPB     1 g 200 mL/hr over 30 Minutes Intravenous On call to O.R. 05/25/19 2226 05/26/19 1148      Assessment/Plan: Doing well Keep clears [place], MD, FACS  05/27/2019

## 2019-05-27 NOTE — Anesthesia Postprocedure Evaluation (Signed)
Anesthesia Post Note  Patient: Dalton Villarreal  Procedure(s) Performed: LAPAROSCOPIC RIGHT COLECTOMY (N/A )  Patient location during evaluation: PACU Anesthesia Type: General Level of consciousness: awake and alert Pain management: pain level controlled Vital Signs Assessment: post-procedure vital signs reviewed and stable Respiratory status: spontaneous breathing, nonlabored ventilation, respiratory function stable and patient connected to nasal cannula oxygen Cardiovascular status: blood pressure returned to baseline and stable Postop Assessment: no apparent nausea or vomiting Anesthetic complications: no     Last Vitals:  Vitals:   05/26/19 2024 05/27/19 0619  BP: (!) 159/87 120/78  Pulse: 67 64  Resp: 20   Temp: 36.6 C 36.9 C  SpO2: 97% 97%    Last Pain:  Vitals:   05/27/19 0619  TempSrc: Oral  PainSc:                  Precious Haws Piscitello

## 2019-05-28 LAB — GLUCOSE, CAPILLARY
Glucose-Capillary: 105 mg/dL — ABNORMAL HIGH (ref 70–99)
Glucose-Capillary: 87 mg/dL (ref 70–99)
Glucose-Capillary: 102 mg/dL — ABNORMAL HIGH (ref 70–99)
Glucose-Capillary: 112 mg/dL — ABNORMAL HIGH (ref 70–99)

## 2019-05-28 NOTE — Progress Notes (Signed)
[facility] Hospital Day(s): 2.   Post op day(s): 2 Days Post-Op.   Interval History: Patient seen and examined, no acute events or new complaints overnight. Patient reports he is feeling good this morning. No complaints of fever, chills, nausea, or emesis. He has had a BM and continues to pass flatus. His hiccups have resolved. Tolerated CLD without issue. No further complaints. Mobilizing well.   Vital signs in last 24 hours: [min-max] current  Temp:  [97.3 F (36.3 C)-98 F (36.7 C)] 97.4 F (36.3 C) (10/15 0430) Pulse Rate:  [68-75] 68 (10/15 0430) Resp:  [18-20] 18 (10/15 0430) BP: (127-159)/(79-100) 159/96 (10/15 0430) SpO2:  [97 %-100 %] 97 % (10/15 0430)     Height: 5\' 4"  (162.6 cm) Weight: 83 kg BMI (Calculated): 31.38   Intake/Output last 2 shifts:  10/14 0701 - 10/15 0700 In: 1092.4 [I.V.:1092.4] Out: 850 [Urine:850]   Physical Exam:  Constitutional: alert, cooperative and no distress  Respiratory: breathing non-labored at rest  Cardiovascular: regular rate and sinus rhythm  Gastrointestinal: soft, non-tender, and non-distended. No rebound or guarding Integumentary: Laparoscopic and laparotomy incisions are CDI with staples, no surrounding erythema or drainage  Labs:  CBC Latest Ref Rng & Units 05/27/2019 05/26/2019 05/01/2019  WBC 4.0 - 10.5 K/uL 9.7 9.6 4.2  Hemoglobin 13.0 - 17.0 g/dL 11.9(L) 13.9 13.9  Hematocrit 39.0 - 52.0 % 34.7(L) 40.7 40.2  Platelets 150 - 400 K/uL 133(L) 124(L) 175   CMP Latest Ref Rng & Units 05/27/2019 05/26/2019 05/01/2019  Glucose 70 - 99 mg/dL 137(H) - 97  BUN 8 - 23 mg/dL 18 - 13  Creatinine 0.61 - 1.24 mg/dL 1.11 1.12 1.15  Sodium 135 - 145 mmol/L 139 - 139  Potassium 3.5 - 5.1 mmol/L 4.0 - 3.6  Chloride 98 - 111 mmol/L 109 - 101  CO2 22 - 32 mmol/L 21(L) - 31  Calcium 8.9 - 10.3 mg/dL 8.2(L) - 9.3  Total Protein 6.5 - 8.1 g/dL - - 7.2  Total Bilirubin 0.3 - 1.2 mg/dL - - 0.8  Alkaline  Phos 38 - 126 U/L - - 52  AST 15 - 41 U/L - - 24  ALT 0 - 44 U/L - - 23    Imaging studies: No new pertinent imaging studies   Assessment/Plan:  73 y.o. male overall doing well with return of bowel function 2 Days Post-Op s/p hand-assisted laparoscopic right hemicolectomy for right colon cancer.   - Advance to full liquid diet; soft diet for dinner   - discontinue IVF  - pain control prn; antiemetics prn  - monitor abdominal examination; on-going bowel function  - encouraged mobilization   - medical management of comorbidities   - DVT prophylaxis   - Discharge planning: if tolerates advancement of diet, hopefully home in next 24 hours   All of the above findings and recommendations were discussed with the patient, and the medical team, and all of patient's questions were answered to his expressed satisfaction.  -- Edison Simon, PA-C [facility] 05/28/2019, 8:03 AM [phone] M-F: 7am - 4pm

## 2019-05-29 LAB — GLUCOSE, CAPILLARY: Glucose-Capillary: 97 mg/dL (ref 70–99)

## 2019-05-29 MED ORDER — OXYCODONE HCL 5 MG PO TABS: 5.0000 mg | ORAL_TABLET | Freq: Four times a day (QID) | ORAL | 0 refills | Status: DC | PRN

## 2019-05-29 NOTE — Discharge Summary (Signed)
[facility] SURGICAL DISCHARGE SUMMARY  Patient ID: Dalton Villarreal MRN: [id number] DOB/AGE: 1946-07-28 73 y.o.  Admit date: 05/26/2019 Discharge date: 05/29/2019  Discharge Diagnoses Patient Active Problem List   Diagnosis Date Noted  . Colon cancer ([place]) 05/26/2019  . Cancer ([place])   . Malignant neoplasm of colon ([place]) 05/01/2019    Consultants None  Procedures 05/26/2019:  Hand assisted Laparoscopic Right Hemicolectomy With stapled ileocolostomy   HPI: Dalton Villarreal is a 73 y.o. 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   Hospital Course: Informed consent was obtained and documented, and patient underwent uneventful hand-assisted laparoscopic right hemicolectomy with staples ileocolostomy (Dr Dahlia Byes, 05/26/2019).  Post-operatively, patient did very well and advancement of patient's diet and ambulation were well-tolerated. The remainder of patient's hospital course was essentially unremarkable, and discharge planning was initiated accordingly with patient safely able to be discharged home with appropriate discharge instructions, pain control, and outpatient follow-up after all of his questions were answered to his expressed satisfaction.  Discharge Condition: Good  Physical Examination:  Constitutional: Well appearing male, NAD Pulmonary: Normal effort, No respiratory Distress Gastrointestinal: Soft, non-tender, non-distended, no rebound/guarding Skin: Laparoscopic and laparotomy incisions are CDI with staples, no erythema, no drainage    Allergies as of 05/29/2019   No Known Allergies     Medication List    STOP taking these medications   bisacodyl 5 MG EC tablet Commonly known as: DULCOLAX   erythromycin base 500 MG tablet Commonly known as: E-MYCIN   neomycin 500 MG tablet Commonly known as: MYCIFRADIN     TAKE  these medications   Aspirin Low Dose 81 MG EC tablet Generic drug: aspirin Take 81 mg by mouth daily.   atorvastatin 40 MG tablet Commonly known as: LIPITOR Take 40 mg by mouth at bedtime.   cyclobenzaprine 10 MG tablet Commonly known as: FLEXERIL Take 10 mg by mouth 3 (three) times daily as needed for muscle spasms.   Durezol 0.05 % Emul Generic drug: Difluprednate Place 1 drop into both eyes 2 (two) times daily.   enalapril 20 MG tablet Commonly known as: VASOTEC Take 20 mg by mouth 2 (two) times daily.   gabapentin 300 MG capsule Commonly known as: NEURONTIN Take 300 mg by mouth every morning.   hydrochlorothiazide 25 MG tablet Commonly known as: HYDRODIURIL Take 25 mg by mouth daily.   ketorolac 0.5 % ophthalmic solution Commonly known as: ACULAR Place 1 drop into both eyes 3 (three) times daily.   metFORMIN 500 MG 24 hr tablet Commonly known as: GLUCOPHAGE-XR Take 500 mg by mouth daily with breakfast.   metoprolol tartrate 25 MG tablet Commonly known as: LOPRESSOR Take 25 mg by mouth 2 (two) times daily.   naproxen 500 MG tablet Commonly known as: NAPROSYN Take 500 mg by mouth daily.   oxyCODONE 5 MG immediate release tablet Commonly known as: Oxy IR/ROXICODONE Take 1 tablet (5 mg total) by mouth every 6 (six) hours as needed for severe pain or breakthrough pain.   potassium chloride 8 MEQ tablet Commonly known as: KLOR-CON Take 8 mEq by mouth daily.        Follow-up Information    Pabon, [place] F, MD. Schedule an appointment as soon as possible for a visit in 1 week(s).   Specialty: General Surgery Why: s/p right colectomy for colon cancer...can see Thedore Mins PA Contact information: [address] [place] [address] [phone]            Time spent on discharge management including discussion of hospital course, clinical condition, outpatient instructions, prescriptions, and follow up with the patient and members of the  medical team: >30 minutes  -- Edison Simon , PA-C [facility]  05/29/2019, 9:01 AM [phone] M-F: 7am - 4pm'

## 2019-06-01 LAB — SURGICAL PATHOLOGY

## 2019-06-03 ENCOUNTER — Ambulatory Visit (INDEPENDENT_AMBULATORY_CARE_PROVIDER_SITE_OTHER): Payer: Self-pay | Admitting: Physician Assistant

## 2019-06-03 ENCOUNTER — Encounter: Payer: Self-pay | Admitting: Physician Assistant

## 2019-06-03 ENCOUNTER — Other Ambulatory Visit: Payer: Self-pay

## 2019-06-03 VITALS — BP 103/71 | HR 69 | Temp 97.3°F | Ht 64.0 in | Wt 179.4 lb

## 2019-06-03 DIAGNOSIS — Z09 Encounter for follow-up examination after completed treatment for conditions other than malignant neoplasm: Secondary | ICD-10-CM

## 2019-06-03 DIAGNOSIS — C182 Malignant neoplasm of ascending colon: Secondary | ICD-10-CM

## 2019-06-03 MED ORDER — CYCLOBENZAPRINE HCL 10 MG PO TABS: 10.0000 mg | ORAL_TABLET | Freq: Three times a day (TID) | ORAL | 0 refills | Status: DC | PRN

## 2019-06-03 NOTE — Progress Notes (Signed)
[facility] POST-OP OFFICE VISIT  06/03/2019  HPI: Dalton Villarreal is a 73 y.o. male 8 days s/p hand assisted-right hemicolectomy with Dr Dahlia Byes for right colon CA.  He presents with his daughter. Doing well. He is having pain in his abdominal. Described as a tight soreness. Worse with ambulation. He is otherwise doing well. No fever, chills, nausea, emesis. Tolerating PO and having bowel function. No other acute issues.   Vital signs: BP 103/71   Pulse 69   Temp (!) 97.3 F (36.3 C) (Temporal)   Ht 5\' 4"  (1.626 m)   Wt 179 lb 6.4 oz (81.4 kg)   SpO2 98%   BMI 30.79 kg/m    Physical Exam: Constitutional: Well appearing male, NAD Abdomen: Soft, incisional tenderness, non-distended, no rebound or guarding Skin: Laparotomy incision is CDI with staples (removed), no erythema or drainage, there is ecchymosis  Assessment/Plan: This is a 73 y.o. male 8 days s/p hand assisted-right hemicolectomy    - Pain control prn; added flexeril  - okay to shower  - reviewed lifting restrictions  - encouraged light activity   - reviewed nutritional recommendation; protein for healing  - Reviewed pathology: Invasive adenocarcinoma, negative margin, negative lymph nodes  - follow up with oncology as scheduled on 10/28  - follow up with Dr Dahlia Byes on 10/28 after oncology to review pathology, discuss any further treatment  -- Edison Simon, PA-C [facility] 06/03/2019, 11:45 AM [phone] M-F: 7am - 4pm

## 2019-06-03 NOTE — Patient Instructions (Addendum)
Patient is to continue with taking the Tylenol, Motrin and Oxycodone for the pain and discomfort. Please pick up prescription at local pharmacy. Patient may continue to take showers, no baths. Patient may walk to exercise. Patient will follow up in a week with Dr.Pabon 06/10/19 at 3:15 pm.   Laparoscopic Colectomy, Care After This sheet gives you information about how to care for yourself after your procedure. Your health care provider may also give you more specific instructions. If you have problems or questions, contact your health care provider. What can I expect after the procedure? After your procedure, it is common to have the following:  Pain in your abdomen, especially in the incision areas. You will be given medicine to control the pain.  Tiredness. This is a normal part of the recovery process. Your energy level will return to normal over the next several weeks.  Changes in your bowel movements, such as constipation or needing to go more often. Talk with your health care provider about how to manage this. Follow these instructions at home: Medicines  Take over-the-counter and prescription medicines only as told by your health care provider.  Do not drive or use heavy machinery while taking prescription pain medicine.  Do not drink alcohol while taking prescription pain medicine.  If you were prescribed an antibiotic medicine, use it as told by your health care provider. Do not stop using the antibiotic even if you start to feel better. Incision care   Follow instructions from your health care provider about how to take care of your incision areas. Make sure you: ? Keep your incisions clean and dry. ? Wash your hands with soap and water before and after applying medicine to the areas, and before and after changing your bandage (dressing). If soap and water are not available, use hand sanitizer. ? Change your dressing as told by your health care provider. ? Leave stitches  (sutures), skin glue, or adhesive strips in place. These skin closures may need to stay in place for 2 weeks or longer. If adhesive strip edges start to loosen and curl up, you may trim the loose edges. Do not remove adhesive strips completely unless your health care provider tells you to do that.  Do not wear tight clothing over the incisions. Tight clothing may rub and irritate the incision areas, which may cause the incisions to open.  Do not take baths, swim, or use a hot tub until your health care provider approves. Ask your health care provider if you can take showers. You may only be allowed to take sponge baths for bathing.  Check your incision area every day for signs of infection. Check for: ? More redness, swelling, or pain. ? More fluid or blood. ? Warmth. ? Pus or a bad smell. Activity  Avoid lifting anything that is heavier than 10 lb (4.5 kg) for 2 weeks or until your health care provider says it is okay.  You may resume normal activities as told by your health care provider. Ask your health care provider what activities are safe for you.  Take rest breaks during the day as needed. Eating and drinking  Follow instructions from your health care provider about what you can eat after surgery.  To prevent or treat constipation while you are taking prescription pain medicine, your health care provider may recommend that you: ? Drink enough fluid to keep your urine clear or pale yellow. ? Take over-the-counter or prescription medicines. ? Eat foods that are high in  fiber, such as fresh fruits and vegetables, whole grains, and beans. ? Limit foods that are high in fat and processed sugars, such as fried and sweet foods. General instructions  Ask your health care provider when you will need an appointment to get your sutures or staples removed.  Keep all follow-up visits as told by your health care provider. This is important. Contact a health care provider if:  You have more  redness, swelling, or pain around your incisions.  You have more fluid or blood coming from the incisions.  Your incisions feel warm to the touch.  You have pus or a bad smell coming from your incisions or your dressing.  You have a fever.  You have an incision that breaks open (edges not staying together) after sutures or staples have been removed. Get help right away if:  You develop a rash.  You have chest pain or difficulty breathing.  You have pain or swelling in your legs.  You feel light-headed or you faint.  Your abdomen swells (becomes distended).  You have nausea or vomiting.  You have blood in your stool (feces). This information is not intended to replace advice given to you by your health care provider. Make sure you discuss any questions you have with your health care provider. Document Released: 02/16/2005 Document Revised: 04/18/2018 Document Reviewed: 04/30/2016 Elsevier Patient Education  2020 [employer].

## 2019-06-04 ENCOUNTER — Telehealth: Payer: Self-pay

## 2019-06-04 NOTE — Telephone Encounter (Signed)
Noted pT1 pN0. MMR intact.

## 2019-06-10 ENCOUNTER — Inpatient Hospital Stay: Payer: Medicaid Other | Attending: Oncology | Admitting: Oncology

## 2019-06-10 ENCOUNTER — Encounter: Payer: Self-pay | Admitting: Oncology

## 2019-06-10 ENCOUNTER — Other Ambulatory Visit: Payer: Self-pay

## 2019-06-10 ENCOUNTER — Telehealth (INDEPENDENT_AMBULATORY_CARE_PROVIDER_SITE_OTHER): Payer: Self-pay | Admitting: Surgery

## 2019-06-10 VITALS — BP 105/75 | HR 70 | Temp 97.8°F | Resp 16 | Wt 174.0 lb

## 2019-06-10 DIAGNOSIS — Z9049 Acquired absence of other specified parts of digestive tract: Secondary | ICD-10-CM | POA: Diagnosis not present

## 2019-06-10 DIAGNOSIS — Z809 Family history of malignant neoplasm, unspecified: Secondary | ICD-10-CM

## 2019-06-10 DIAGNOSIS — I2510 Atherosclerotic heart disease of native coronary artery without angina pectoris: Secondary | ICD-10-CM | POA: Diagnosis not present

## 2019-06-10 DIAGNOSIS — C182 Malignant neoplasm of ascending colon: Secondary | ICD-10-CM

## 2019-06-10 DIAGNOSIS — Z634 Disappearance and death of family member: Secondary | ICD-10-CM | POA: Insufficient documentation

## 2019-06-10 DIAGNOSIS — Z8042 Family history of malignant neoplasm of prostate: Secondary | ICD-10-CM | POA: Insufficient documentation

## 2019-06-10 DIAGNOSIS — Z803 Family history of malignant neoplasm of breast: Secondary | ICD-10-CM | POA: Insufficient documentation

## 2019-06-10 DIAGNOSIS — I700 Atherosclerosis of aorta: Secondary | ICD-10-CM | POA: Diagnosis not present

## 2019-06-10 DIAGNOSIS — Z79899 Other long term (current) drug therapy: Secondary | ICD-10-CM | POA: Insufficient documentation

## 2019-06-10 NOTE — Progress Notes (Signed)
Reports appetite not well and he gets heartburn after eating.  Has lost 5 pounds since last documented weight 7 days ago.

## 2019-06-13 DIAGNOSIS — Z809 Family history of malignant neoplasm, unspecified: Secondary | ICD-10-CM | POA: Insufficient documentation

## 2019-06-13 NOTE — Progress Notes (Signed)
Hematology/Oncology Consult note [facility] [phone] [phone]   Patient Care Team: Donnie Coffin, MD as PCP - General (Family Medicine) Clent Jacks, RN as Oncology Nurse Navigator  REFERRING PROVIDER: Donnie Coffin, MD  CHIEF COMPLAINTS/REASON FOR VISIT:  Follow up for colon cancer  HISTORY OF PRESENTING ILLNESS:   Dalton Villarreal is a  73 y.o.  male with PMH listed below was seen in consultation at the request of  Donnie Coffin, MD  for evaluation of colon cancer Patient recently had a routine screening colonoscopy on 04/21/2019. 04/21/2007 colonoscopy showed a fungating nonobstructive large mass was found in the proximal ascending colon.  The mass was not circumferential.  Mass was biopsied. 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 Patient reports he is feeling well at baseline.  Denies any fever, chills, unintentional weight loss, change of bowel habit, blood in the stool, or abdominal pain.  Family history positive for brother with prostate cancer, mother passed away from breast cancer.  #Family history of cancer, recommend genetic testing.  Patient wants to defer.  #Radiographic evidence of CAD on CT scanning.  Discussed with patient.  Lifestyle modification discussed.  Recommend patient to continue follow-up with primary care physician for further management.   INTERVAL HISTORY Dalton Villarreal is a 73 y.o. male who has above history reviewed by me today presents for follow up visit for management of colon cancer Problems and complaints are listed below: 05/26/2019 patient underwent right hemicolectomy. Pathology positive for invasive adenocarcinoma.  21 lymph node harvested.  Lymphovascular invasion present.  Perineural invasion not identified.  Tumor deposits not identified.  All margins are negative.  pT1 pN0 Patient reports doing well today.  Denies any pain.   Review of Systems    Constitutional: Negative for appetite change, chills, fatigue, fever and unexpected weight change.  HENT:   Negative for hearing loss and voice change.   Eyes: Negative for eye problems and icterus.  Respiratory: Negative for chest tightness, cough and shortness of breath.   Cardiovascular: Negative for chest pain and leg swelling.  Gastrointestinal: Negative for abdominal distention and abdominal pain.  Endocrine: Negative for hot flashes.  Genitourinary: Negative for difficulty urinating, dysuria and frequency.   Musculoskeletal: Negative for arthralgias.  Skin: Negative for itching and rash.  Neurological: Negative for light-headedness and numbness.  Hematological: Negative for adenopathy. Does not bruise/bleed easily.  Psychiatric/Behavioral: Negative for confusion.    MEDICAL HISTORY:  Past Medical History:  Diagnosis Date   Cancer ([place])    Diabetes mellitus without complication ([place])    Hypertension    Ruptured lumbar disc     SURGICAL HISTORY: Past Surgical History:  Procedure Laterality Date   COLONOSCOPY     COLONOSCOPY WITH PROPOFOL N/A 04/21/2019   Procedure: COLONOSCOPY WITH PROPOFOL;  Surgeon: [name], MD;  Location: [facility] ENDOSCOPY;  Service: Gastroenterology;  Laterality: N/A;   EYE SURGERY Bilateral    cataracts bilaterally   LAPAROSCOPIC RIGHT COLECTOMY N/A 05/26/2019   Procedure: LAPAROSCOPIC RIGHT COLECTOMY;  Surgeon: Jules Husbands, MD;  Location: [facility] ORS;  Service: General;  Laterality: N/A;   LUMBAR LAMINECTOMY     UPPER GI ENDOSCOPY      SOCIAL HISTORY: Social History   Socioeconomic History   Marital status: Legally Separated    Spouse name: Not on file   Number of children: Not on file   Years of education: Not on file   Highest education level: Not on file  Occupational History   Not on file  Social Needs   Financial resource strain: Not on file   Food insecurity    Worry: Not on file    Inability: Not on file    Transportation needs    Medical: Not on file    Non-medical: Not on file  Tobacco Use   Smoking status: Never Smoker   Smokeless tobacco: Never Used  Substance and Sexual Activity   Alcohol use: Yes    Frequency: Never    Comment: less than one beer a week   Drug use: Never   Sexual activity: Not on file  Lifestyle   Physical activity    Days per week: Not on file    Minutes per session: Not on file   Stress: Not on file  Relationships   Social connections    Talks on phone: Not on file    Gets together: Not on file    Attends religious service: Not on file    Active member of club or organization: Not on file    Attends meetings of clubs or organizations: Not on file    Relationship status: Not on file   Intimate partner violence    Fear of current or ex partner: Not on file    Emotionally abused: Not on file    Physically abused: Not on file    Forced sexual activity: Not on file  Other Topics Concern   Not on file  Social History Narrative   Not on file    FAMILY HISTORY: Family History  Problem Relation Age of Onset   Breast cancer Mother    Prostate cancer Brother     ALLERGIES:  has No Known Allergies.  MEDICATIONS:  Current Outpatient Medications  Medication Sig Dispense Refill   ASPIRIN LOW DOSE 81 MG EC tablet Take 81 mg by mouth daily.      atorvastatin (LIPITOR) 40 MG tablet Take 40 mg by mouth at bedtime.      cyclobenzaprine (FLEXERIL) 10 MG tablet Take 1 tablet (10 mg total) by mouth 3 (three) times daily as needed for muscle spasms. 30 tablet 0   enalapril (VASOTEC) 20 MG tablet Take 20 mg by mouth 2 (two) times daily.      gabapentin (NEURONTIN) 300 MG capsule Take 300 mg by mouth every morning.      hydrochlorothiazide (HYDRODIURIL) 25 MG tablet Take 25 mg by mouth daily.      metFORMIN (GLUCOPHAGE-XR) 500 MG 24 hr tablet Take 500 mg by mouth daily with breakfast.      metoprolol tartrate (LOPRESSOR) 25 MG tablet Take 25 mg  by mouth 2 (two) times daily.      oxyCODONE (OXY IR/ROXICODONE) 5 MG immediate release tablet Take 1 tablet (5 mg total) by mouth every 6 (six) hours as needed for severe pain or breakthrough pain. 20 tablet 0   potassium chloride (KLOR-CON) 8 MEQ tablet Take 8 mEq by mouth daily.      naproxen (NAPROSYN) 500 MG tablet Take 500 mg by mouth daily.     No current facility-administered medications for this visit.      PHYSICAL EXAMINATION: ECOG PERFORMANCE STATUS: 0 - Asymptomatic Vitals:   06/10/19 1421  BP: 105/75  Pulse: 70  Resp: 16  Temp: 97.8 F (36.6 C)   Filed Weights   06/10/19 1421  Weight: 174 lb (78.9 kg)    Physical Exam Constitutional:      General: He is not in acute distress.  HENT:     Head: Normocephalic and atraumatic.  Eyes:     General: No scleral icterus.    Pupils: Pupils are equal, round, and reactive to light.  Neck:     Musculoskeletal: Normal range of motion and neck supple.  Cardiovascular:     Rate and Rhythm: Normal rate and regular rhythm.     Heart sounds: Normal heart sounds.  Pulmonary:     Effort: Pulmonary effort is normal. No respiratory distress.     Breath sounds: No wheezing.  Abdominal:     General: Bowel sounds are normal. There is no distension.     Palpations: Abdomen is soft. There is no mass.     Tenderness: There is no abdominal tenderness.  Musculoskeletal: Normal range of motion.        General: No deformity.  Skin:    General: Skin is warm and dry.     Findings: No erythema or rash.  Neurological:     Mental Status: He is alert and oriented to person, place, and time.     Cranial Nerves: No cranial nerve deficit.     Coordination: Coordination normal.  Psychiatric:        Behavior: Behavior normal.        Thought Content: Thought content normal.     LABORATORY DATA:  I have reviewed the data as listed Lab Results  Component Value Date   WBC 9.7 05/27/2019   HGB 11.9 (L) 05/27/2019   HCT 34.7 (L)  05/27/2019   MCV 91.3 05/27/2019   PLT 133 (L) 05/27/2019   Recent Labs    05/01/19 1610 05/26/19 1916 05/27/19 0635  NA 139  --  139  K 3.6  --  4.0  CL 101  --  109  CO2 31  --  21*  GLUCOSE 97  --  137*  BUN 13  --  18  CREATININE 1.15 1.12 1.11  CALCIUM 9.3  --  8.2*  GFRNONAA >60 >60 >60  GFRAA >60 >60 >60  PROT 7.2  --   --   ALBUMIN 4.0  --   --   AST 24  --   --   ALT 23  --   --   ALKPHOS 52  --   --   BILITOT 0.8  --   --    Iron/TIBC/Ferritin/ %Sat No results found for: IRON, TIBC, FERRITIN, IRONPCTSAT   Baseline CEA has been checked on 04/23/2019 which was 3.7. RADIOGRAPHIC STUDIES: I have personally reviewed the radiological images as listed and agreed with the findings in the report. Ct Chest W Contrast  Result Date: 05/01/2019 CLINICAL DATA:  73 year old male with history of mass noted in the proximal ascending colon on recent colonoscopy. Follow-up study. EXAM: CT CHEST, ABDOMEN, AND PELVIS WITH CONTRAST TECHNIQUE: Multidetector CT imaging of the chest, abdomen and pelvis was performed following the standard protocol during bolus administration of intravenous contrast. CONTRAST:  170mL OMNIPAQUE IOHEXOL 300 MG/ML  SOLN COMPARISON:  No priors. FINDINGS: CT CHEST FINDINGS Cardiovascular: Heart size is normal. There is no significant pericardial fluid, thickening or pericardial calcification. There is aortic atherosclerosis, as well as atherosclerosis of the great vessels of the mediastinum and the coronary arteries, including calcified atherosclerotic plaque in the left anterior descending and right coronary arteries. Mediastinum/Nodes: No pathologically enlarged mediastinal or hilar lymph nodes. Esophagus is unremarkable in appearance. No axillary lymphadenopathy. Lungs/Pleura: No suspicious appearing pulmonary nodules or masses are noted. No acute consolidative airspace disease. No  pleural effusions. Mild linear scarring in the lung bases bilaterally. Musculoskeletal:  There are no aggressive appearing lytic or blastic lesions noted in the visualized portions of the skeleton. CT ABDOMEN PELVIS FINDINGS Hepatobiliary: No suspicious cystic or solid hepatic lesions. No intra or extrahepatic biliary ductal dilatation. Gallbladder is normal in appearance. Pancreas: No pancreatic mass. No pancreatic ductal dilatation. No pancreatic or peripancreatic fluid collections or inflammatory changes. Spleen: Unremarkable. Adrenals/Urinary Tract: Right kidney and bilateral adrenal glands are normal in appearance. In the upper pole the left kidney there is an exophytic 4.6 cm low-attenuation lesion which does not enhance, compatible with a simple cyst. No hydroureteronephrosis. Urinary bladder is unremarkable in appearance. Stomach/Bowel: Normal appearance of the stomach. No pathologic dilatation of small bowel or colon. Circumferential thickening of the distal rectum best appreciated on axial image 111 of series 2. In addition, in the mid ascending colon (axial image 73 of series 2 and coronal image 84 of series 5) there is a 2.6 x 2.8 x 2.4 cm circumferential mass which mildly narrows the colonic lumen. Normal appendix. Vascular/Lymphatic: Aortic atherosclerosis, without evidence of aneurysm or dissection in the abdominal or pelvic vasculature. No lymphadenopathy noted in the abdomen or pelvis. Reproductive: Prostate gland and seminal vesicles are unremarkable in appearance. Other: No significant volume of ascites.  No pneumoperitoneum. Musculoskeletal: There are no aggressive appearing lytic or blastic lesions noted in the visualized portions of the skeleton. IMPRESSION: 1. 2.6 x 2.8 x 2.4 cm circumferential mass in the mid ascending colon, corresponding to the recently diagnosed colonic neoplasm. 2. Circumferential thickening of the distal rectum. Assuming no mass was noted in this region on recent colonoscopy, this may simply reflect a proctitis, but clinical correlation is recommended. 3. No  signs of metastatic disease in the chest, abdomen or pelvis. 4. Aortic atherosclerosis, in addition to 2 vessel coronary artery disease. Assessment for potential risk factor modification, dietary therapy or pharmacologic therapy may be warranted, if clinically indicated. 5. Additional incidental findings, as above. Electronically Signed   By: Vinnie Langton M.D.   On: 05/01/2019 15:18   Ct Abdomen Pelvis W Contrast  Result Date: 05/01/2019 CLINICAL DATA:  73 year old male with history of mass noted in the proximal ascending colon on recent colonoscopy. Follow-up study. EXAM: CT CHEST, ABDOMEN, AND PELVIS WITH CONTRAST TECHNIQUE: Multidetector CT imaging of the chest, abdomen and pelvis was performed following the standard protocol during bolus administration of intravenous contrast. CONTRAST:  172mL OMNIPAQUE IOHEXOL 300 MG/ML  SOLN COMPARISON:  No priors. FINDINGS: CT CHEST FINDINGS Cardiovascular: Heart size is normal. There is no significant pericardial fluid, thickening or pericardial calcification. There is aortic atherosclerosis, as well as atherosclerosis of the great vessels of the mediastinum and the coronary arteries, including calcified atherosclerotic plaque in the left anterior descending and right coronary arteries. Mediastinum/Nodes: No pathologically enlarged mediastinal or hilar lymph nodes. Esophagus is unremarkable in appearance. No axillary lymphadenopathy. Lungs/Pleura: No suspicious appearing pulmonary nodules or masses are noted. No acute consolidative airspace disease. No pleural effusions. Mild linear scarring in the lung bases bilaterally. Musculoskeletal: There are no aggressive appearing lytic or blastic lesions noted in the visualized portions of the skeleton. CT ABDOMEN PELVIS FINDINGS Hepatobiliary: No suspicious cystic or solid hepatic lesions. No intra or extrahepatic biliary ductal dilatation. Gallbladder is normal in appearance. Pancreas: No pancreatic mass. No pancreatic  ductal dilatation. No pancreatic or peripancreatic fluid collections or inflammatory changes. Spleen: Unremarkable. Adrenals/Urinary Tract: Right kidney and bilateral adrenal glands are normal in appearance. In the  upper pole the left kidney there is an exophytic 4.6 cm low-attenuation lesion which does not enhance, compatible with a simple cyst. No hydroureteronephrosis. Urinary bladder is unremarkable in appearance. Stomach/Bowel: Normal appearance of the stomach. No pathologic dilatation of small bowel or colon. Circumferential thickening of the distal rectum best appreciated on axial image 111 of series 2. In addition, in the mid ascending colon (axial image 73 of series 2 and coronal image 84 of series 5) there is a 2.6 x 2.8 x 2.4 cm circumferential mass which mildly narrows the colonic lumen. Normal appendix. Vascular/Lymphatic: Aortic atherosclerosis, without evidence of aneurysm or dissection in the abdominal or pelvic vasculature. No lymphadenopathy noted in the abdomen or pelvis. Reproductive: Prostate gland and seminal vesicles are unremarkable in appearance. Other: No significant volume of ascites.  No pneumoperitoneum. Musculoskeletal: There are no aggressive appearing lytic or blastic lesions noted in the visualized portions of the skeleton. IMPRESSION: 1. 2.6 x 2.8 x 2.4 cm circumferential mass in the mid ascending colon, corresponding to the recently diagnosed colonic neoplasm. 2. Circumferential thickening of the distal rectum. Assuming no mass was noted in this region on recent colonoscopy, this may simply reflect a proctitis, but clinical correlation is recommended. 3. No signs of metastatic disease in the chest, abdomen or pelvis. 4. Aortic atherosclerosis, in addition to 2 vessel coronary artery disease. Assessment for potential risk factor modification, dietary therapy or pharmacologic therapy may be warranted, if clinically indicated. 5. Additional incidental findings, as above.  Electronically Signed   By: Vinnie Langton M.D.   On: 05/01/2019 15:18       ASSESSMENT & PLAN:  1. Malignant neoplasm of ascending colon ([place])   2. Family history of cancer   3. Coronary artery disease involving native heart without angina pectoris, unspecified vessel or lesion type   Cancer Staging Malignant neoplasm of colon ([place]) Staging form: Colon and Rectum, AJCC 8th Edition - Clinical: No stage assigned - Unsigned - Pathologic: Stage I (pT1, pN0, cM0) - Signed by Earlie Server, MD on 06/13/2019  #Stage I right colon cancer status post right hemicolectomy. Pathology reports was reviewed and discussed with the patient. No new adjuvant chemotherapy will be offered. Recommend surveillance history and physical every 3 to 6 months for the first 2 years. Repeat colonoscopy 1 year after surgery.  Family history of cancer, mother with breast cancer and brother was diagnosed with prostate cancer. We discussed about referring him to genetic counselor.  Patient prefers to discuss in the future  #Radiographic CAD recommend patient to continue follow-up with primary care provider for discussion about lifestyle changes, cholesterol monitoring and control.   Orders Placed This Encounter  Procedures   CBC with Differential/Platelet    Standing Status:   Future    Standing Expiration Date:   06/09/2020   Comprehensive metabolic panel    Standing Status:   Future    Standing Expiration Date:   06/09/2020   CEA    Standing Status:   Future    Standing Expiration Date:   06/09/2020    All questions were answered. The patient knows to call the clinic with any problems questions or concerns.  cc Donnie Coffin, MD    Return of visit: 3 months     Earlie Server, MD, PhD Hematology Oncology [facility] Pager- [phone] 06/13/2019

## 2019-06-15 NOTE — Progress Notes (Signed)
Telemedicine Surgical Follow Up  06/15/2019  Dalton Villarreal is an 73 y.o. male.   No chief complaint on file.   HPI:  Location of the patient:home Time spent on call: 5 min Total Time spent in the encounter including counseling and coordination of care: 10 Location of the Provider: office The patient had given consent for a telemedicine visit and they understands the limitations associated with this including but not limited to privacy, cyber security and technology issues. Persons participating in the visit: pt S/p lap right colectomy, PATH NO EVIDENCE OF met NODAL DISEASE   Past Medical History:  Diagnosis Date  . Cancer ([place])   . Diabetes mellitus without complication ([place])   . Hypertension   . Ruptured lumbar disc     Past Surgical History:  Procedure Laterality Date  . COLONOSCOPY    . COLONOSCOPY WITH PROPOFOL N/A 04/21/2019   Procedure: COLONOSCOPY WITH PROPOFOL;  Surgeon: [name], MD;  Location: [facility] ENDOSCOPY;  Service: Gastroenterology;  Laterality: N/A;  . EYE SURGERY Bilateral    cataracts bilaterally  . LAPAROSCOPIC RIGHT COLECTOMY N/A 05/26/2019   Procedure: LAPAROSCOPIC RIGHT COLECTOMY;  Surgeon: Jules Husbands, MD;  Location: [facility] ORS;  Service: General;  Laterality: N/A;  . LUMBAR LAMINECTOMY    . UPPER GI ENDOSCOPY      Family History  Problem Relation Age of Onset  . Breast cancer Mother   . Prostate cancer Brother     Social History:  reports that he has never smoked. He has never used smokeless tobacco. He reports current alcohol use. He reports that he does not use drugs.  Allergies: No Known Allergies  Medications reviewed.    ROS Full ROS performed and is otherwise negative other than what is stated in HPI   Assessment/Plan: The pt was provided an opportunity to ask questions and all were answered. The pt was advised to call back or seek in-person evaluation if the symptoms worsen or if the condition fails to improve as  anticipated. Greater than 50% of the 10 minutes  visit was spent in counseling/coordination of care F/U ONCOLOGY OUTPT   Caroleen Hamman, MD [place] Surgeon

## 2019-07-07 ENCOUNTER — Other Ambulatory Visit: Payer: Self-pay | Admitting: Family Medicine

## 2019-07-07 DIAGNOSIS — M5417 Radiculopathy, lumbosacral region: Secondary | ICD-10-CM

## 2019-07-13 ENCOUNTER — Other Ambulatory Visit: Payer: Self-pay

## 2019-07-13 ENCOUNTER — Encounter: Payer: Self-pay | Admitting: Gastroenterology

## 2019-07-13 ENCOUNTER — Ambulatory Visit (INDEPENDENT_AMBULATORY_CARE_PROVIDER_SITE_OTHER): Payer: Medicaid Other | Admitting: Gastroenterology

## 2019-07-13 VITALS — BP 143/90 | HR 65 | Temp 97.9°F | Ht 64.0 in | Wt 179.4 lb

## 2019-07-13 DIAGNOSIS — C182 Malignant neoplasm of ascending colon: Secondary | ICD-10-CM

## 2019-07-13 NOTE — Progress Notes (Signed)
[name] MD, MRCP(U.K) [address]  [place]  [place], [place] [address]  Main: [phone]  Fax: [phone]   Primary Care Physician: Donnie Coffin, MD  Primary Gastroenterologist:  Dr. [name]   Chief Complaint  Patient presents with  . Follow-up    Malignant neoplasm of colon    HPI: Dalton Villarreal is a 73 y.o. male was seen by myself on 04/23/2019.  He had a nonobstructing large mass in the proximal ascending colon.  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  Underwent surgery: Right hemicolectomy..  Margins were negative.  Will need repeat colonoscopy in 1 year.  He is doing well no complaints.  Current Outpatient Medications  Medication Sig Dispense Refill  . ASPIRIN LOW DOSE 81 MG EC tablet Take 81 mg by mouth daily.     Marland Kitchen atorvastatin (LIPITOR) 40 MG tablet Take 40 mg by mouth at bedtime.     . cyclobenzaprine (FLEXERIL) 10 MG tablet Take 1 tablet (10 mg total) by mouth 3 (three) times daily as needed for muscle spasms. 30 tablet 0  . enalapril (VASOTEC) 20 MG tablet Take 20 mg by mouth 2 (two) times daily.     Marland Kitchen gabapentin (NEURONTIN) 300 MG capsule Take 300 mg by mouth every morning.     . hydrochlorothiazide (HYDRODIURIL) 25 MG tablet Take 25 mg by mouth daily.     . metFORMIN (GLUCOPHAGE-XR) 500 MG 24 hr tablet Take 500 mg by mouth daily with breakfast.     . metoprolol tartrate (LOPRESSOR) 25 MG tablet Take 25 mg by mouth 2 (two) times daily.     . naproxen (NAPROSYN) 500 MG tablet Take 500 mg by mouth daily.    Marland Kitchen oxyCODONE (OXY IR/ROXICODONE) 5 MG immediate release tablet Take 1 tablet (5 mg total) by mouth every 6 (six) hours as needed for severe pain or breakthrough pain. (Patient not taking: Reported on 07/13/2019) 20 tablet 0  . potassium chloride (KLOR-CON) 8 MEQ tablet Take 8 mEq by mouth daily.      No current facility-administered medications for this visit.     Allergies as of 07/13/2019   . (No Known Allergies)    ROS:  General: Negative for anorexia, weight loss, fever, chills, fatigue, weakness. ENT: Negative for hoarseness, difficulty swallowing , nasal congestion. CV: Negative for chest pain, angina, palpitations, dyspnea on exertion, peripheral edema.  Respiratory: Negative for dyspnea at rest, dyspnea on exertion, cough, sputum, wheezing.  GI: See history of present illness. GU:  Negative for dysuria, hematuria, urinary incontinence, urinary frequency, nocturnal urination.  Endo: Negative for unusual weight change.    Physical Examination:   BP (!) 143/90   Pulse 65   Temp 97.9 F (36.6 C)   Ht 5\' 4"  (1.626 m)   Wt 179 lb 6.4 oz (81.4 kg)   BMI 30.79 kg/m   General: Well-nourished, well-developed in no acute distress.  Eyes: No icterus. Conjunctivae pink. Mouth: Oropharyngeal mucosa moist and pink , no lesions erythema or exudate. Lungs: Clear to auscultation bilaterally. Non-labored. Heart: Regular rate and rhythm, no murmurs rubs or gallops.  Abdomen: Bowel sounds are normal, nontender, nondistended, no hepatosplenomegaly or masses, no abdominal bruits or hernia , no rebound or guarding.   Extremities: No lower extremity edema. No clubbing or deformities. Neuro: Alert and oriented x 3.  Grossly intact. Skin: Warm and dry, no jaundice.   Psych: Alert and cooperative, normal mood and affect.   Imaging  Studies: No results found.  Assessment and Plan:   Dalton Villarreal is a 73 y.o. y/o male recently underwent a right hemicolectomy for a invasive adenocarcinoma.  No metastasis.  This was found on incidental colonoscopy.  Plan 1.  Repeat colonoscopy in 1 year to check for surveillance. 2.  I advised him to have all his children over the age of 38 to undergo a screening colonoscopy.   I have discussed alternative options, risks & benefits,  which include, but are not limited to, bleeding, infection, perforation,respiratory complication & drug reaction.   The patient agrees with this plan & written consent will be obtained.       Dr [name]  MD,MRCP [facility]) Follow up as needed

## 2019-07-22 ENCOUNTER — Ambulatory Visit
Admission: RE | Admit: 2019-07-22 | Discharge: 2019-07-22 | Disposition: A | Discharge status: Home / Self Care | Payer: Medicaid Other | Source: Ambulatory Visit | Attending: Family Medicine | Admitting: Family Medicine

## 2019-07-22 ENCOUNTER — Other Ambulatory Visit: Payer: Self-pay

## 2019-07-22 DIAGNOSIS — M5417 Radiculopathy, lumbosacral region: Secondary | ICD-10-CM | POA: Diagnosis not present

## 2019-07-22 LAB — POCT I-STAT CREATININE: Creatinine, Ser: 1.4 mg/dL — ABNORMAL HIGH (ref 0.61–1.24)

## 2019-07-22 MED ORDER — GADOBUTROL 1 MMOL/ML IV SOLN
7.5000 mL | Freq: Once | INTRAVENOUS | Status: AC | PRN
  Administered 2019-07-22: 7.5 mL via INTRAVENOUS

## 2019-09-07 NOTE — Progress Notes (Signed)
Patient pre screened for office appointment, no questions or concerns today. Patient reminded of upcoming appointment time and date. 

## 2019-09-08 ENCOUNTER — Encounter: Payer: Self-pay | Admitting: Oncology

## 2019-09-08 ENCOUNTER — Inpatient Hospital Stay (HOSPITAL_BASED_OUTPATIENT_CLINIC_OR_DEPARTMENT_OTHER): Payer: Medicaid Other | Admitting: Oncology

## 2019-09-08 ENCOUNTER — Inpatient Hospital Stay: Payer: Medicaid Other | Attending: Oncology

## 2019-09-08 ENCOUNTER — Other Ambulatory Visit: Payer: Self-pay

## 2019-09-08 VITALS — BP 95/73 | HR 58 | Temp 97.7°F | Resp 18 | Wt 173.4 lb

## 2019-09-08 DIAGNOSIS — M5136 Other intervertebral disc degeneration, lumbar region: Secondary | ICD-10-CM | POA: Insufficient documentation

## 2019-09-08 DIAGNOSIS — Z8042 Family history of malignant neoplasm of prostate: Secondary | ICD-10-CM | POA: Insufficient documentation

## 2019-09-08 DIAGNOSIS — Z809 Family history of malignant neoplasm, unspecified: Secondary | ICD-10-CM | POA: Diagnosis not present

## 2019-09-08 DIAGNOSIS — R200 Anesthesia of skin: Secondary | ICD-10-CM | POA: Diagnosis not present

## 2019-09-08 DIAGNOSIS — Z9049 Acquired absence of other specified parts of digestive tract: Secondary | ICD-10-CM | POA: Insufficient documentation

## 2019-09-08 DIAGNOSIS — I2510 Atherosclerotic heart disease of native coronary artery without angina pectoris: Secondary | ICD-10-CM | POA: Diagnosis not present

## 2019-09-08 DIAGNOSIS — M4316 Spondylolisthesis, lumbar region: Secondary | ICD-10-CM | POA: Diagnosis not present

## 2019-09-08 DIAGNOSIS — N281 Cyst of kidney, acquired: Secondary | ICD-10-CM | POA: Insufficient documentation

## 2019-09-08 DIAGNOSIS — Z79899 Other long term (current) drug therapy: Secondary | ICD-10-CM | POA: Diagnosis not present

## 2019-09-08 DIAGNOSIS — M79604 Pain in right leg: Secondary | ICD-10-CM | POA: Insufficient documentation

## 2019-09-08 DIAGNOSIS — C182 Malignant neoplasm of ascending colon: Secondary | ICD-10-CM

## 2019-09-08 DIAGNOSIS — Z803 Family history of malignant neoplasm of breast: Secondary | ICD-10-CM | POA: Insufficient documentation

## 2019-09-08 LAB — CBC WITH DIFFERENTIAL/PLATELET
Abs Immature Granulocytes: 0.01 10*3/uL (ref 0.00–0.07)
Basophils Absolute: 0 10*3/uL (ref 0.0–0.1)
Basophils Relative: 0 %
Eosinophils Absolute: 0.2 10*3/uL (ref 0.0–0.5)
Eosinophils Relative: 4 %
HCT: 39 % (ref 39.0–52.0)
Hemoglobin: 12.7 g/dL — ABNORMAL LOW (ref 13.0–17.0)
Immature Granulocytes: 0 %
Lymphocytes Relative: 35 %
Lymphs Abs: 1.6 10*3/uL (ref 0.7–4.0)
MCH: 30.7 pg (ref 26.0–34.0)
MCHC: 32.6 g/dL (ref 30.0–36.0)
MCV: 94.2 fL (ref 80.0–100.0)
Monocytes Absolute: 0.6 10*3/uL (ref 0.1–1.0)
Monocytes Relative: 12 %
Neutro Abs: 2.2 10*3/uL (ref 1.7–7.7)
Neutrophils Relative %: 49 %
Platelets: 181 10*3/uL (ref 150–400)
RBC: 4.14 MIL/uL — ABNORMAL LOW (ref 4.22–5.81)
RDW: 13.7 % (ref 11.5–15.5)
WBC: 4.5 10*3/uL (ref 4.0–10.5)
nRBC: 0 % (ref 0.0–0.2)

## 2019-09-08 LAB — COMPREHENSIVE METABOLIC PANEL
ALT: 31 U/L (ref 0–44)
AST: 32 U/L (ref 15–41)
Albumin: 4 g/dL (ref 3.5–5.0)
Alkaline Phosphatase: 48 U/L (ref 38–126)
Anion gap: 7 (ref 5–15)
BUN: 20 mg/dL (ref 8–23)
CO2: 31 mmol/L (ref 22–32)
Calcium: 9.5 mg/dL (ref 8.9–10.3)
Chloride: 99 mmol/L (ref 98–111)
Creatinine, Ser: 1.15 mg/dL (ref 0.61–1.24)
GFR calc Af Amer: >60 mL/min (ref >60)
GFR calc non Af Amer: >60 mL/min (ref >60)
Glucose, Bld: 106 mg/dL — ABNORMAL HIGH (ref 70–99)
Potassium: 4 mmol/L (ref 3.5–5.1)
Sodium: 137 mmol/L (ref 135–145)
Total Bilirubin: 0.8 mg/dL (ref 0.3–1.2)
Total Protein: 7.5 g/dL (ref 6.5–8.1)

## 2019-09-09 LAB — CEA: CEA: 2.1 ng/mL (ref 0.0–4.7)

## 2019-09-09 NOTE — Progress Notes (Signed)
Hematology/Oncology Consult note [facility] [phone] [phone]   Patient Care Team: Donnie Coffin, MD as PCP - General (Family Medicine) Clent Jacks, RN as Oncology Nurse Navigator  REFERRING PROVIDER: Donnie Coffin, MD  CHIEF COMPLAINTS/REASON FOR VISIT:  Follow up for colon cancer  HISTORY OF PRESENTING ILLNESS:   Dalton Villarreal is a  74 y.o.  male with PMH listed below was seen in consultation at the request of  Donnie Coffin, MD  for evaluation of colon cancer Patient recently had a routine screening colonoscopy on 04/21/2019. 04/21/2007 colonoscopy showed a fungating nonobstructive large mass was found in the proximal ascending colon.  The mass was not circumferential.  Mass was biopsied. 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 Patient reports he is feeling well at baseline.  Denies any fever, chills, unintentional weight loss, change of bowel habit, blood in the stool, or abdominal pain.  Family history positive for brother with prostate cancer, mother passed away from breast cancer.  #Family history of cancer, recommend genetic testing.  Patient wants to defer.  #Radiographic evidence of CAD on CT scanning.  Discussed with patient.  Lifestyle modification discussed.  Recommend patient to continue follow-up with primary care physician for further management.  # 05/26/2019 patient underwent right hemicolectomy. Pathology positive for invasive adenocarcinoma.  21 lymph node harvested.  Lymphovascular invasion present.  Perineural invasion not identified.  Tumor deposits not identified.  All margins are negative.  pT1 pN0   INTERVAL HISTORY Dalton Villarreal is a 74 y.o. male who has above history reviewed by me today presents for follow up visit for management of colon cancer Problems and complaints are listed below: Patient reports that he is feeling well.  Denies any bloody or black stool.   Denies any pain today.  Review of Systems  Constitutional: Negative for appetite change, chills, fatigue, fever and unexpected weight change.  HENT:   Negative for hearing loss and voice change.   Eyes: Negative for eye problems and icterus.  Respiratory: Negative for chest tightness, cough and shortness of breath.   Cardiovascular: Negative for chest pain and leg swelling.  Gastrointestinal: Negative for abdominal distention and abdominal pain.  Endocrine: Negative for hot flashes.  Genitourinary: Negative for difficulty urinating, dysuria and frequency.   Musculoskeletal: Negative for arthralgias.  Skin: Negative for itching and rash.  Neurological: Negative for light-headedness and numbness.  Hematological: Negative for adenopathy. Does not bruise/bleed easily.  Psychiatric/Behavioral: Negative for confusion.    MEDICAL HISTORY:  Past Medical History:  Diagnosis Date  . Cancer ([place])   . Diabetes mellitus without complication ([place])   . Hypertension   . Ruptured lumbar disc     SURGICAL HISTORY: Past Surgical History:  Procedure Laterality Date  . COLONOSCOPY    . COLONOSCOPY WITH PROPOFOL N/A 04/21/2019   Procedure: COLONOSCOPY WITH PROPOFOL;  Surgeon: [name], MD;  Location: [facility] ENDOSCOPY;  Service: Gastroenterology;  Laterality: N/A;  . EYE SURGERY Bilateral    cataracts bilaterally  . LAPAROSCOPIC RIGHT COLECTOMY N/A 05/26/2019   Procedure: LAPAROSCOPIC RIGHT COLECTOMY;  Surgeon: Jules Husbands, MD;  Location: [facility] ORS;  Service: General;  Laterality: N/A;  . LUMBAR LAMINECTOMY    . UPPER GI ENDOSCOPY      SOCIAL HISTORY: Social History   Socioeconomic History  . Marital status: Legally Separated    Spouse name: Not on file  . Number of children: Not on file  . Years of education: Not on  file  . Highest education level: Not on file  Occupational History  . Not on file  Tobacco Use  . Smoking status: Never Smoker  . Smokeless tobacco: Never Used    Substance and Sexual Activity  . Alcohol use: Yes    Comment: less than one beer a week  . Drug use: Never  . Sexual activity: Not on file  Other Topics Concern  . Not on file  Social History Narrative  . Not on file   Social Determinants of Health   Financial Resource Strain:   . Difficulty of Paying Living Expenses: Not on file  Food Insecurity:   . Worried About Charity fundraiser in the Last Year: Not on file  . Ran Out of Food in the Last Year: Not on file  Transportation Needs:   . Lack of Transportation (Medical): Not on file  . Lack of Transportation (Non-Medical): Not on file  Physical Activity:   . Days of Exercise per Week: Not on file  . Minutes of Exercise per Session: Not on file  Stress:   . Feeling of Stress : Not on file  Social Connections:   . Frequency of Communication with Friends and Family: Not on file  . Frequency of Social Gatherings with Friends and Family: Not on file  . Attends Religious Services: Not on file  . Active Member of Clubs or Organizations: Not on file  . Attends Archivist Meetings: Not on file  . Marital Status: Not on file  Intimate Partner Violence:   . Fear of Current or Ex-Partner: Not on file  . Emotionally Abused: Not on file  . Physically Abused: Not on file  . Sexually Abused: Not on file    FAMILY HISTORY: Family History  Problem Relation Age of Onset  . Breast cancer Mother   . Prostate cancer Brother     ALLERGIES:  has No Known Allergies.  MEDICATIONS:  Current Outpatient Medications  Medication Sig Dispense Refill  . acetaminophen (TYLENOL) 500 MG tablet Take 500 mg by mouth every 6 (six) hours as needed.    . ASPIRIN LOW DOSE 81 MG EC tablet Take 81 mg by mouth daily.     Marland Kitchen atorvastatin (LIPITOR) 40 MG tablet Take 40 mg by mouth at bedtime.     . cyclobenzaprine (FLEXERIL) 10 MG tablet Take 1 tablet (10 mg total) by mouth 3 (three) times daily as needed for muscle spasms. 30 tablet 0  .  enalapril (VASOTEC) 20 MG tablet Take 20 mg by mouth 2 (two) times daily.     Marland Kitchen gabapentin (NEURONTIN) 300 MG capsule Take 300 mg by mouth every morning.     . hydrochlorothiazide (HYDRODIURIL) 25 MG tablet Take 25 mg by mouth daily.     . metFORMIN (GLUCOPHAGE-XR) 500 MG 24 hr tablet Take 500 mg by mouth daily with breakfast.     . metoprolol tartrate (LOPRESSOR) 25 MG tablet Take 25 mg by mouth 2 (two) times daily.     . naproxen (NAPROSYN) 500 MG tablet Take 500 mg by mouth daily.    . Omega-3 Fatty Acids (FISH OIL) 1000 MG CAPS Take by mouth.    . potassium chloride (KLOR-CON) 8 MEQ tablet Take 8 mEq by mouth daily.     . sildenafil (VIAGRA) 100 MG tablet Take by mouth.    . oxyCODONE (OXY IR/ROXICODONE) 5 MG immediate release tablet Take 1 tablet (5 mg total) by mouth every 6 (six) hours as needed  for severe pain or breakthrough pain. (Patient not taking: Reported on 07/13/2019) 20 tablet 0   No current facility-administered medications for this visit.     PHYSICAL EXAMINATION: ECOG PERFORMANCE STATUS: 0 - Asymptomatic Vitals:   09/08/19 1401  BP: 95/73  Pulse: (!) 58  Resp: 18  Temp: 97.7 F (36.5 C)   Filed Weights   09/08/19 1401  Weight: 173 lb 6.4 oz (78.7 kg)    Physical Exam Constitutional:      General: He is not in acute distress. HENT:     Head: Normocephalic and atraumatic.  Eyes:     General: No scleral icterus.    Pupils: Pupils are equal, round, and reactive to light.  Cardiovascular:     Rate and Rhythm: Normal rate and regular rhythm.     Heart sounds: Normal heart sounds.  Pulmonary:     Effort: Pulmonary effort is normal. No respiratory distress.     Breath sounds: No wheezing.  Abdominal:     General: Bowel sounds are normal. There is no distension.     Palpations: Abdomen is soft. There is no mass.     Tenderness: There is no abdominal tenderness.  Musculoskeletal:        General: No deformity. Normal range of motion.     Cervical back:  Normal range of motion and neck supple.  Skin:    General: Skin is warm and dry.     Findings: No erythema or rash.  Neurological:     Mental Status: He is alert and oriented to person, place, and time.     Cranial Nerves: No cranial nerve deficit.     Coordination: Coordination normal.  Psychiatric:        Behavior: Behavior normal.        Thought Content: Thought content normal.     LABORATORY DATA:  I have reviewed the data as listed Lab Results  Component Value Date   WBC 4.5 09/08/2019   HGB 12.7 (L) 09/08/2019   HCT 39.0 09/08/2019   MCV 94.2 09/08/2019   PLT 181 09/08/2019   Recent Labs    05/01/19 1610 05/01/19 1610 05/26/19 1916 05/26/19 1916 05/27/19 0635 07/22/19 1615 09/08/19 1351  NA 139  --   --   --  139  --  137  K 3.6  --   --   --  4.0  --  4.0  CL 101  --   --   --  109  --  99  CO2 31  --   --   --  21*  --  31  GLUCOSE 97  --   --   --  137*  --  106*  BUN 13  --   --   --  18  --  20  CREATININE 1.15   < > 1.12   < > 1.11 1.40* 1.15  CALCIUM 9.3  --   --   --  8.2*  --  9.5  GFRNONAA >60   < > >60  --  >60  --  >60  GFRAA >60   < > >60  --  >60  --  >60  PROT 7.2  --   --   --   --   --  7.5  ALBUMIN 4.0  --   --   --   --   --  4.0  AST 24  --   --   --   --   --  32  ALT 23  --   --   --   --   --  31  ALKPHOS 52  --   --   --   --   --  48  BILITOT 0.8  --   --   --   --   --  0.8   < > = values in this interval not displayed.   Iron/TIBC/Ferritin/ %Sat No results found for: IRON, TIBC, FERRITIN, IRONPCTSAT   Baseline CEA has been checked on 04/23/2019 which was 3.7. RADIOGRAPHIC STUDIES: I have personally reviewed the radiological images as listed and agreed with the findings in the report. MR Lumbar Spine W Wo Contrast  Result Date: 07/22/2019 CLINICAL DATA:  Low back pain with right leg pain. Numbness and balance disturbance. EXAM: MRI LUMBAR SPINE WITHOUT AND WITH CONTRAST TECHNIQUE: Multiplanar and multiecho pulse sequences of  the lumbar spine were obtained without and with intravenous contrast. CONTRAST:  7.8mL GADAVIST GADOBUTROL 1 MMOL/ML IV SOLN COMPARISON:  07/18/2015 FINDINGS: Segmentation: 5 lumbar type vertebral bodies as numbered previously. Alignment: Development of 4 mm of degenerative anterolisthesis at L4-5. Vertebrae:  No fracture or primary bone lesion. Conus medullaris and cauda equina: Conus extends to the L1 level. Conus and cauda equina appear normal. Paraspinal and other soft tissues: Distended bladder. Left renal cyst. Disc levels: No abnormality at L2-3 or above. L3-4: Disc degeneration with shallow disc protrusion. Facet and ligamentous hypertrophy. Stenosis of lateral recesses that could cause neural compression on either or both sides. Worsening since 2016. L4-5: Advanced bilateral facet arthropathy now with 4 mm of anterolisthesis. Broad-based disc herniation. Previous posterior decompression. Stenosis of the lateral recesses and neural foramina that could cause neural compression on either or both sides. L5-S1: Previous posterior decompression. Disc space narrowing. Endplate osteophytes and bulging of the disc. Facet hypertrophy. Stenosis of both subarticular lateral recesses that could cause neural compression on either or both sides. Bilateral foraminal narrowing as well. 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 IMPRESSION: L3-4: Shallow disc protrusion. Facet and ligamentous hypertrophy. Stenosis of both lateral recesses that could be symptomatic. Worsened since 2016. L4-5: Previous posterior decompression and discectomy. Worsened facet arthropathy with anterolisthesis 4 mm presently. Broad-based disc protrusion. Stenosis of the lateral recesses and neural foramina likely to cause neural compression. L5-S1: Previous posterior decompression and discectomy. Endplate osteophytes and bulging of the disc. Stenosis of the lateral recesses and neural foramina that could be symptomatic. Arachnoiditis pattern also noted  at this level. Electronically Signed   By: Nelson Chimes M.D.   On: 07/22/2019 16:49       ASSESSMENT & PLAN:  1. Malignant neoplasm of ascending colon ([place])   Cancer Staging Malignant neoplasm of colon ([place]) Staging form: Colon and Rectum, AJCC 8th Edition - Clinical: No stage assigned - Unsigned - Pathologic: Stage I (pT1, pN0, cM0) - Signed by Earlie Server, MD on 06/13/2019  #Stage I right colon cancer status post right hemicolectomy. Labs are reviewed and discussed with patient.  Stable counts.  CEA is 2.1. Continue surveillance history and physical examination every 6 months for the first 2 years. Repeat colonoscopy 1 year after surgery.  Family history of cancer, mother with breast cancer and a brother with prostate cancer and personal history of colon cancer. We discussed about genetic counseling again today.  Patient agrees.  I will refer him to establish care with genetic counselor.   Orders Placed This Encounter  Procedures  . CBC with Differential/Platelet    Standing Status:  Future    Standing Expiration Date:   09/07/2020  . Comprehensive metabolic panel    Standing Status:   Future    Standing Expiration Date:   09/07/2020  . CEA    Standing Status:   Future    Standing Expiration Date:   09/07/2020  . Ambulatory referral to Genetics    Referral Priority:   Routine    Referral Type:   Consultation    Referral Reason:   Specialty Services Required    Referred to Provider:   Faith Rogue T    Number of Visits Requested:   1    All questions were answered. The patient knows to call the clinic with any problems questions or concerns.  cc Donnie Coffin, MD    Return of visit: 6 months  Earlie Server, MD, PhD Hematology Oncology [facility] Pager- [phone] 09/09/2019

## 2020-01-29 ENCOUNTER — Other Ambulatory Visit: Payer: Self-pay | Admitting: Neurosurgery

## 2020-01-29 DIAGNOSIS — G894 Chronic pain syndrome: Secondary | ICD-10-CM

## 2020-02-04 ENCOUNTER — Inpatient Hospital Stay: Payer: Medicaid Other

## 2020-02-04 ENCOUNTER — Inpatient Hospital Stay: Payer: Medicaid Other | Attending: Oncology | Admitting: Licensed Clinical Social Worker

## 2020-02-17 ENCOUNTER — Other Ambulatory Visit: Payer: Self-pay

## 2020-02-17 ENCOUNTER — Ambulatory Visit
Admission: RE | Admit: 2020-02-17 | Discharge: 2020-02-17 | Disposition: A | Discharge status: Home / Self Care | Payer: Medicaid Other | Source: Ambulatory Visit | Attending: Neurosurgery | Admitting: Neurosurgery

## 2020-02-17 DIAGNOSIS — G894 Chronic pain syndrome: Secondary | ICD-10-CM | POA: Diagnosis present

## 2020-03-04 ENCOUNTER — Encounter: Payer: Self-pay | Admitting: Oncology

## 2020-03-04 ENCOUNTER — Other Ambulatory Visit: Payer: Self-pay

## 2020-03-04 ENCOUNTER — Inpatient Hospital Stay (HOSPITAL_BASED_OUTPATIENT_CLINIC_OR_DEPARTMENT_OTHER): Payer: Medicaid Other | Admitting: Oncology

## 2020-03-04 ENCOUNTER — Inpatient Hospital Stay: Payer: Medicaid Other | Attending: Oncology

## 2020-03-04 VITALS — BP 112/64 | HR 60 | Temp 97.4°F | Resp 18 | Ht 64.0 in | Wt 169.4 lb

## 2020-03-04 DIAGNOSIS — C182 Malignant neoplasm of ascending colon: Secondary | ICD-10-CM | POA: Diagnosis not present

## 2020-03-04 DIAGNOSIS — I2510 Atherosclerotic heart disease of native coronary artery without angina pectoris: Secondary | ICD-10-CM | POA: Insufficient documentation

## 2020-03-04 DIAGNOSIS — M4802 Spinal stenosis, cervical region: Secondary | ICD-10-CM | POA: Insufficient documentation

## 2020-03-04 DIAGNOSIS — E876 Hypokalemia: Secondary | ICD-10-CM | POA: Insufficient documentation

## 2020-03-04 DIAGNOSIS — Z9049 Acquired absence of other specified parts of digestive tract: Secondary | ICD-10-CM | POA: Insufficient documentation

## 2020-03-04 DIAGNOSIS — Z800 Family history of malignant neoplasm of digestive organs: Secondary | ICD-10-CM | POA: Insufficient documentation

## 2020-03-04 DIAGNOSIS — Z79899 Other long term (current) drug therapy: Secondary | ICD-10-CM | POA: Diagnosis not present

## 2020-03-04 DIAGNOSIS — Z803 Family history of malignant neoplasm of breast: Secondary | ICD-10-CM | POA: Diagnosis not present

## 2020-03-04 DIAGNOSIS — R200 Anesthesia of skin: Secondary | ICD-10-CM | POA: Insufficient documentation

## 2020-03-04 DIAGNOSIS — N281 Cyst of kidney, acquired: Secondary | ICD-10-CM | POA: Insufficient documentation

## 2020-03-04 DIAGNOSIS — M545 Low back pain: Secondary | ICD-10-CM | POA: Diagnosis not present

## 2020-03-04 DIAGNOSIS — Z809 Family history of malignant neoplasm, unspecified: Secondary | ICD-10-CM | POA: Diagnosis not present

## 2020-03-04 DIAGNOSIS — Z8042 Family history of malignant neoplasm of prostate: Secondary | ICD-10-CM | POA: Insufficient documentation

## 2020-03-04 LAB — CBC WITH DIFFERENTIAL/PLATELET
Abs Immature Granulocytes: 0.04 10*3/uL (ref 0.00–0.07)
Basophils Absolute: 0 10*3/uL (ref 0.0–0.1)
Basophils Relative: 1 %
Eosinophils Absolute: 0.2 10*3/uL (ref 0.0–0.5)
Eosinophils Relative: 3 %
HCT: 38.3 % — ABNORMAL LOW (ref 39.0–52.0)
Hemoglobin: 13.6 g/dL (ref 13.0–17.0)
Immature Granulocytes: 1 %
Lymphocytes Relative: 31 %
Lymphs Abs: 1.6 10*3/uL (ref 0.7–4.0)
MCH: 32.4 pg (ref 26.0–34.0)
MCHC: 35.5 g/dL (ref 30.0–36.0)
MCV: 91.2 fL (ref 80.0–100.0)
Monocytes Absolute: 0.7 10*3/uL (ref 0.1–1.0)
Monocytes Relative: 13 %
Neutro Abs: 2.6 10*3/uL (ref 1.7–7.7)
Neutrophils Relative %: 51 %
Platelets: 160 10*3/uL (ref 150–400)
RBC: 4.2 MIL/uL — ABNORMAL LOW (ref 4.22–5.81)
RDW: 13.5 % (ref 11.5–15.5)
WBC: 5 10*3/uL (ref 4.0–10.5)
nRBC: 0 % (ref 0.0–0.2)

## 2020-03-04 LAB — COMPREHENSIVE METABOLIC PANEL
ALT: 27 U/L (ref 0–44)
AST: 37 U/L (ref 15–41)
Albumin: 4.2 g/dL (ref 3.5–5.0)
Alkaline Phosphatase: 57 U/L (ref 38–126)
Anion gap: 8 (ref 5–15)
BUN: 16 mg/dL (ref 8–23)
CO2: 28 mmol/L (ref 22–32)
Calcium: 9.1 mg/dL (ref 8.9–10.3)
Chloride: 102 mmol/L (ref 98–111)
Creatinine, Ser: 1.03 mg/dL (ref 0.61–1.24)
GFR calc Af Amer: >60 mL/min (ref >60)
GFR calc non Af Amer: >60 mL/min (ref >60)
Glucose, Bld: 92 mg/dL (ref 70–99)
Potassium: 3.2 mmol/L — ABNORMAL LOW (ref 3.5–5.1)
Sodium: 138 mmol/L (ref 135–145)
Total Bilirubin: 0.9 mg/dL (ref 0.3–1.2)
Total Protein: 7.6 g/dL (ref 6.5–8.1)

## 2020-03-04 NOTE — Progress Notes (Signed)
Hematology/Oncology  Follow up note [facility] [phone] [phone]) [phone]   Patient Care Team: Donnie Coffin, MD as PCP - General (Family Medicine) Clent Jacks, RN as Oncology Nurse Navigator  REFERRING PROVIDER: Donnie Coffin, MD  CHIEF COMPLAINTS/REASON FOR VISIT:  Follow up for colon cancer  HISTORY OF PRESENTING ILLNESS:   Dalton Villarreal is a  74 y.o.  male with PMH listed below was seen in consultation at the request of  Donnie Coffin, MD  for evaluation of colon cancer Patient recently had a routine screening colonoscopy on 04/21/2019. 04/21/2007 colonoscopy showed a fungating nonobstructive large mass was found in the proximal ascending colon.  The mass was not circumferential.  Mass was biopsied. 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 Patient reports he is feeling well at baseline.  Denies any fever, chills, unintentional weight loss, change of bowel habit, blood in the stool, or abdominal pain.  Family history positive for brother with prostate cancer, mother passed away from breast cancer.  #Family history of cancer, recommend genetic testing.  Patient wants to defer.  #Radiographic evidence of CAD on CT scanning.  Discussed with patient.  Lifestyle modification discussed.  Recommend patient to continue follow-up with primary care physician for further management.  # 05/26/2019 patient underwent right hemicolectomy. Pathology positive for invasive adenocarcinoma.  21 lymph node harvested.  Lymphovascular invasion present.  Perineural invasion not identified.  Tumor deposits not identified.  All margins are negative.  pT1 pN0   INTERVAL HISTORY Dalton Villarreal is a 74 y.o. male who has above history reviewed by me today presents for follow up visit for management of colon cancer Problems and complaints are listed below: Patient reports no new complaints. Denies any bloody or black stool.   Denies any pain today He takes potassium 8 mEq daily.  Denies any nausea vomiting diarrhea.  Review of Systems  Constitutional: Negative for appetite change, chills, fatigue, fever and unexpected weight change.  HENT:   Negative for hearing loss and voice change.   Eyes: Negative for eye problems and icterus.  Respiratory: Negative for chest tightness, cough and shortness of breath.   Cardiovascular: Negative for chest pain and leg swelling.  Gastrointestinal: Negative for abdominal distention and abdominal pain.  Endocrine: Negative for hot flashes.  Genitourinary: Negative for difficulty urinating, dysuria and frequency.   Musculoskeletal: Negative for arthralgias.  Skin: Negative for itching and rash.  Neurological: Negative for light-headedness and numbness.  Hematological: Negative for adenopathy. Does not bruise/bleed easily.  Psychiatric/Behavioral: Negative for confusion.    MEDICAL HISTORY:  Past Medical History:  Diagnosis Date  . Cancer ([place])   . Diabetes mellitus without complication ([place])   . Hypertension   . Ruptured lumbar disc     SURGICAL HISTORY: Past Surgical History:  Procedure Laterality Date  . COLONOSCOPY    . COLONOSCOPY WITH PROPOFOL N/A 04/21/2019   Procedure: COLONOSCOPY WITH PROPOFOL;  Surgeon: [name], MD;  Location: [facility] ENDOSCOPY;  Service: Gastroenterology;  Laterality: N/A;  . EYE SURGERY Bilateral    cataracts bilaterally  . LAPAROSCOPIC RIGHT COLECTOMY N/A 05/26/2019   Procedure: LAPAROSCOPIC RIGHT COLECTOMY;  Surgeon: Jules Husbands, MD;  Location: [facility] ORS;  Service: General;  Laterality: N/A;  . LUMBAR LAMINECTOMY    . UPPER GI ENDOSCOPY      SOCIAL HISTORY: Social History   Socioeconomic History  . Marital status: Legally Separated    Spouse name: Not on file  . Number of  children: Not on file  . Years of education: Not on file  . Highest education level: Not on file  Occupational History  . Not on file  Tobacco Use  .  Smoking status: Never Smoker  . Smokeless tobacco: Never Used  Vaping Use  . Vaping Use: Never used  Substance and Sexual Activity  . Alcohol use: Yes    Comment: less than one beer a week  . Drug use: Never  . Sexual activity: Not on file  Other Topics Concern  . Not on file  Social History Narrative  . Not on file   Social Determinants of Health   Financial Resource Strain:   . Difficulty of Paying Living Expenses:   Food Insecurity:   . Worried About Charity fundraiser in the Last Year:   . Arboriculturist in the Last Year:   Transportation Needs:   . Film/video editor (Medical):   Marland Kitchen Lack of Transportation (Non-Medical):   Physical Activity:   . Days of Exercise per Week:   . Minutes of Exercise per Session:   Stress:   . Feeling of Stress :   Social Connections:   . Frequency of Communication with Friends and Family:   . Frequency of Social Gatherings with Friends and Family:   . Attends Religious Services:   . Active Member of Clubs or Organizations:   . Attends Archivist Meetings:   Marland Kitchen Marital Status:   Intimate Partner Violence:   . Fear of Current or Ex-Partner:   . Emotionally Abused:   Marland Kitchen Physically Abused:   . Sexually Abused:     FAMILY HISTORY: Family History  Problem Relation Age of Onset  . Breast cancer Mother   . Prostate cancer Brother     ALLERGIES:  has No Known Allergies.  MEDICATIONS:  Current Outpatient Medications  Medication Sig Dispense Refill  . acetaminophen (TYLENOL) 500 MG tablet Take 500 mg by mouth every 6 (six) hours as needed.    . ASPIRIN LOW DOSE 81 MG EC tablet Take 81 mg by mouth daily.     Marland Kitchen atorvastatin (LIPITOR) 40 MG tablet Take 40 mg by mouth at bedtime.     . cyclobenzaprine (FLEXERIL) 10 MG tablet Take 1 tablet (10 mg total) by mouth 3 (three) times daily as needed for muscle spasms. 30 tablet 0  . enalapril (VASOTEC) 20 MG tablet Take 20 mg by mouth 2 (two) times daily.     Marland Kitchen gabapentin  (NEURONTIN) 300 MG capsule Take 300 mg by mouth every morning.     . hydrochlorothiazide (HYDRODIURIL) 25 MG tablet Take 25 mg by mouth daily.     . metFORMIN (GLUCOPHAGE-XR) 500 MG 24 hr tablet Take 500 mg by mouth daily with breakfast.     . metoprolol tartrate (LOPRESSOR) 25 MG tablet Take 25 mg by mouth 2 (two) times daily.     . naproxen (NAPROSYN) 500 MG tablet Take 500 mg by mouth daily.    . Omega-3 Fatty Acids (FISH OIL) 1000 MG CAPS Take by mouth.    . oxyCODONE (OXY IR/ROXICODONE) 5 MG immediate release tablet Take 1 tablet (5 mg total) by mouth every 6 (six) hours as needed for severe pain or breakthrough pain. (Patient not taking: Reported on 07/13/2019) 20 tablet 0  . potassium chloride (KLOR-CON) 8 MEQ tablet Take 8 mEq by mouth daily.     . sildenafil (VIAGRA) 100 MG tablet Take by mouth.  No current facility-administered medications for this visit.     PHYSICAL EXAMINATION: ECOG PERFORMANCE STATUS: 1 - Symptomatic but completely ambulatory Vitals:   03/04/20 1311  BP: (!) 112/64  Pulse: 60  Resp: 18  Temp: (!) 97.4 F (36.3 C)  SpO2: 100%   Filed Weights   03/04/20 1311  Weight: 169 lb 6.4 oz (76.8 kg)    Physical Exam Constitutional:      General: He is not in acute distress. HENT:     Head: Normocephalic and atraumatic.  Eyes:     General: No scleral icterus.    Pupils: Pupils are equal, round, and reactive to light.  Cardiovascular:     Rate and Rhythm: Normal rate and regular rhythm.     Heart sounds: Normal heart sounds.  Pulmonary:     Effort: Pulmonary effort is normal. No respiratory distress.     Breath sounds: No wheezing.  Abdominal:     General: Bowel sounds are normal. There is no distension.     Palpations: Abdomen is soft. There is no mass.     Tenderness: There is no abdominal tenderness.  Musculoskeletal:        General: No deformity. Normal range of motion.     Cervical back: Normal range of motion and neck supple.  Skin:     General: Skin is warm and dry.     Findings: No erythema or rash.  Neurological:     Mental Status: He is alert and oriented to person, place, and time.     Cranial Nerves: No cranial nerve deficit.     Coordination: Coordination normal.  Psychiatric:        Behavior: Behavior normal.        Thought Content: Thought content normal.     LABORATORY DATA:  I have reviewed the data as listed Lab Results  Component Value Date   WBC 5.0 03/04/2020   HGB 13.6 03/04/2020   HCT 38.3 (L) 03/04/2020   MCV 91.2 03/04/2020   PLT 160 03/04/2020   Recent Labs    05/01/19 1610 05/26/19 1916 05/27/19 0635 05/27/19 0635 07/22/19 1615 09/08/19 1351 03/04/20 1233  NA 139  --  139  --   --  137 138  K 3.6  --  4.0  --   --  4.0 3.2*  CL 101  --  109  --   --  99 102  CO2 31  --  21*  --   --  31 28  GLUCOSE 97  --  137*  --   --  106* 92  BUN 13  --  18  --   --  20 16  CREATININE 1.15   < > 1.11   < > 1.40* 1.15 1.03  CALCIUM 9.3  --  8.2*  --   --  9.5 9.1  GFRNONAA >60   < > >60  --   --  >60 >60  GFRAA >60   < > >60  --   --  >60 >60  PROT 7.2  --   --   --   --  7.5 7.6  ALBUMIN 4.0  --   --   --   --  4.0 4.2  AST 24  --   --   --   --  32 37  ALT 23  --   --   --   --  31 27  ALKPHOS 52  --   --   --   --  48 57  BILITOT 0.8  --   --   --   --  0.8 0.9   < > = values in this interval not displayed.   Iron/TIBC/Ferritin/ %Sat No results found for: IRON, TIBC, FERRITIN, IRONPCTSAT   Baseline CEA has been checked on 04/23/2019 which was 3.7. RADIOGRAPHIC STUDIES: I have personally reviewed the radiological images as listed and agreed with the findings in the report. MR THORACIC SPINE WO CONTRAST  Result Date: 02/17/2020 CLINICAL DATA:  74 year old male with chronic mid and lower back pain radiating anteriorly and to the legs. Bilateral leg numbness. EXAM: MRI THORACIC SPINE WITHOUT CONTRAST TECHNIQUE: Multiplanar, multisequence MR imaging of the thoracic spine was performed. No  intravenous contrast was administered. COMPARISON:  Lumbar MRI 07/22/2019. FINDINGS: Limited cervical spine imaging: Widespread advanced cervical disc and endplate degeneration suspected, likely with multilevel significant cervical spinal stenosis (series 16, image 5). Thoracic spine segmentation:  Appears to be normal. Alignment: Relatively normal thoracic kyphosis. No spondylolisthesis. No scoliosis. Vertebrae: No marrow edema or evidence of acute osseous abnormality. Visualized bone marrow signal is within normal limits. Preserved thoracic vertebral height. Cord: No thoracic spinal cord signal abnormality. Generally capacious thoracic spinal canal. The conus medullaris is visible at T12-L1 and appears normal. Paraspinal and other soft tissues: Negative visible thoracic and upper abdominal viscera; chronic benign appearing left renal cyst. Negative thoracic paraspinal soft tissues. Disc levels: C7-T1: Disc space loss with bulky posterior and rightward disc (series 17, image 7) resulting in mild spinal stenosis. No definite cord mass effect (series 22, image 2). There is moderate to severe C8 foraminal stenosis which appears greater on the right. T1-T2: Circumferential disc bulge and mild posterior element hypertrophy. No spinal stenosis. Moderate to severe T1 foraminal stenosis perhaps greater on the right. T2-T3: Mild disc bulge. Posterior element hypertrophy greater on the left. No spinal stenosis. Moderate to severe left T2 foraminal stenosis. T3-T4: Mild disc bulge. Posterior element hypertrophy. Mild bilateral T3 foraminal stenosis. T4-T5: Minimal disc bulge. Posterior element hypertrophy greater on the right. Moderate right T4 foraminal stenosis. T5-T6: Mild disc bulge.  No stenosis. T6-T7: Mild disc bulge.  No stenosis. T7-T8: Circumferential disc bulge with broad-based posterior and right foraminal component. No spinal stenosis. Borderline to mild right T7 foraminal stenosis. T8-T9: Circumferential disc  bulge. Posterior element hypertrophy. No spinal stenosis. Mild left T8 foraminal stenosis. T9-T10: Mild facet hypertrophy.  No stenosis. T10-T11: Mild facet hypertrophy.  No stenosis. T11-T12: Mild to moderate facet hypertrophy. Mild bilateral T11 foraminal stenosis. T12-L1: Negative. IMPRESSION: 1. No acute osseous abnormality in the thoracic spine. 2. Widespread thoracic disc bulging and posterior element degeneration, but no thoracic spinal stenosis. There is Moderate or severe neural foraminal stenosis at the bilateral T1, left T2, and right T4 nerve levels. 3. 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 Electronically Signed   By: Genevie Ann M.D.   On: 02/17/2020 14:45       ASSESSMENT & PLAN:  1. Malignant neoplasm of ascending colon ([place])   2. Family history of cancer   3. Hypokalemia   Cancer Staging Malignant neoplasm of colon ([place]) Staging form: Colon and Rectum, AJCC 8th Edition - Clinical: No stage assigned - Unsigned - Pathologic: Stage I (pT1, pN0, cM0) - Signed by Earlie Server, MD on 06/13/2019  #Stage I right colon cancer status post right hemicolectomy. Labs are reviewed and discussed with patient. Stable counts.  CEA is pending.  Continue surveillance with history and physical examination every 6 months for the first 2 years.  Follow-up with Dr. Vicente Males for repeat colonoscopy 1 year after surgery which is going to be October 2021.   #Hypokalemia, potassium is 3.2 Patient currently takes potassium 8 mEq daily.  I asked patient to take potassium 8 mEq twice daily for 2 days and resume to 8 mEq daily and follow-up with primary care provider. Family history of cancer, mother with breast cancer and a brother with prostate cancer and personal history of colon cancer.  We discussed about genetic testing and has referred patient.  Orders Placed This Encounter  Procedures  . CBC with  Differential/Platelet    Standing Status:   Future    Standing Expiration Date:   03/04/2021  . Comprehensive metabolic panel    Standing Status:   Future    Standing Expiration Date:   03/04/2021  . CEA    Standing Status:   Future    Standing Expiration Date:   03/04/2021    All questions were answered. The patient knows to call the clinic with any problems questions or concerns.  Return of visit: 6 months  Earlie Server, MD, PhD Hematology Oncology [facility] Pager- [phone] 03/04/2020

## 2020-03-04 NOTE — Progress Notes (Signed)
No new changes noted today 

## 2020-03-05 LAB — CEA: CEA: 3 ng/mL (ref 0.0–4.7)

## 2020-03-22 ENCOUNTER — Encounter: Payer: Self-pay | Admitting: Student in an Organized Health Care Education/Training Program

## 2020-03-22 ENCOUNTER — Other Ambulatory Visit: Payer: Self-pay

## 2020-03-22 ENCOUNTER — Ambulatory Visit (HOSPITAL_BASED_OUTPATIENT_CLINIC_OR_DEPARTMENT_OTHER): Payer: Medicaid Other | Admitting: Student in an Organized Health Care Education/Training Program

## 2020-03-22 ENCOUNTER — Ambulatory Visit
Admission: RE | Admit: 2020-03-22 | Discharge: 2020-03-22 | Disposition: A | Discharge status: Home / Self Care | Payer: Medicaid Other | Source: Ambulatory Visit | Attending: Student in an Organized Health Care Education/Training Program | Admitting: Student in an Organized Health Care Education/Training Program

## 2020-03-22 VITALS — BP 143/94 | HR 78 | Temp 97.2°F | Resp 16 | Ht 64.0 in | Wt 169.2 lb

## 2020-03-22 DIAGNOSIS — M5136 Other intervertebral disc degeneration, lumbar region: Secondary | ICD-10-CM | POA: Insufficient documentation

## 2020-03-22 DIAGNOSIS — M5416 Radiculopathy, lumbar region: Secondary | ICD-10-CM | POA: Insufficient documentation

## 2020-03-22 DIAGNOSIS — G8929 Other chronic pain: Secondary | ICD-10-CM

## 2020-03-22 DIAGNOSIS — M47816 Spondylosis without myelopathy or radiculopathy, lumbar region: Secondary | ICD-10-CM | POA: Insufficient documentation

## 2020-03-22 DIAGNOSIS — G894 Chronic pain syndrome: Secondary | ICD-10-CM | POA: Diagnosis present

## 2020-03-22 DIAGNOSIS — M48062 Spinal stenosis, lumbar region with neurogenic claudication: Secondary | ICD-10-CM | POA: Insufficient documentation

## 2020-03-22 DIAGNOSIS — M961 Postlaminectomy syndrome, not elsewhere classified: Secondary | ICD-10-CM | POA: Insufficient documentation

## 2020-03-22 NOTE — Patient Instructions (Addendum)
You will complete psych referral appt. Prior to SCS trial at pain clinic.  Moderate Conscious Sedation, Adult Sedation is the use of medicines to promote relaxation and relieve discomfort and anxiety. Moderate conscious sedation is a type of sedation. Under moderate conscious sedation, you are less alert than normal, but you are still able to respond to instructions, touch, or both. Moderate conscious sedation is used during short medical and dental procedures. It is milder than deep sedation, which is a type of sedation under which you cannot be easily woken up. It is also milder than general anesthesia, which is the use of medicines to make you unconscious. Moderate conscious sedation allows you to return to your regular activities sooner. Tell a health care provider about:  Any allergies you have.  All medicines you are taking, including vitamins, herbs, eye drops, creams, and over-the-counter medicines.  Use of steroids (by mouth or creams).  Any problems you or family members have had with sedatives and anesthetic medicines.  Any blood disorders you have.  Any surgeries you have had.  Any medical conditions you have, such as sleep apnea.  Whether you are pregnant or may be pregnant.  Any use of cigarettes, alcohol, marijuana, or street drugs. What are the risks? Generally, this is a safe procedure. However, problems may occur, including:  Getting too much medicine (oversedation).  Nausea.  Allergic reaction to medicines.  Trouble breathing. If this happens, a breathing tube may be used to help with breathing. It will be removed when you are awake and breathing on your own.  Heart trouble.  Lung trouble. What happens before the procedure? Staying hydrated Follow instructions from your health care provider about hydration, which may include:  Up to 2 hours before the procedure - you may continue to drink clear liquids, such as water, clear fruit juice, black coffee, and  plain tea. Eating and drinking restrictions Follow instructions from your health care provider about eating and drinking, which may include:  8 hours before the procedure - stop eating heavy meals or foods such as meat, fried foods, or fatty foods.  6 hours before the procedure - stop eating light meals or foods, such as toast or cereal.  6 hours before the procedure - stop drinking milk or drinks that contain milk.  2 hours before the procedure - stop drinking clear liquids. Medicine Ask your health care provider about:  Changing or stopping your regular medicines. This is especially important if you are taking diabetes medicines or blood thinners.  Taking medicines such as aspirin and ibuprofen. These medicines can thin your blood. Do not take these medicines before your procedure if your health care provider instructs you not to.  Tests and exams  You will have a physical exam.  You may have blood tests done to show: ? How well your kidneys and liver are working. ? How well your blood can clot. General instructions  Plan to have someone take you home from the hospital or clinic.  If you will be going home right after the procedure, plan to have someone with you for 24 hours. What happens during the procedure?  An IV tube will be inserted into one of your veins.  Medicine to help you relax (sedative) will be given through the IV tube.  The medical or dental procedure will be performed. What happens after the procedure?  Your blood pressure, heart rate, breathing rate, and blood oxygen level will be monitored often until the medicines you were given  have worn off.  Do not drive for 24 hours. This information is not intended to replace advice given to you by your health care provider. Make sure you discuss any questions you have with your health care provider. Document Revised: 07/12/2017 Document Reviewed: 11/19/2015 Elsevier Patient Education  [place]  What are the risk, side effects and possible complications? Generally speaking, most procedures are safe.  However, with any procedure there are risks, side effects, and the possibility of complications.  The risks and complications are dependent upon the sites that are lesioned, or the type of nerve block to be performed.  The closer the procedure is to the spine, the more serious the risks are.  Great care is taken when placing the radio frequency needles, block needles or lesioning probes, but sometimes complications can occur. 1. Infection: Any time there is an injection through the skin, there is a risk of infection.  This is why sterile conditions are used for these blocks.  There are four possible types of infection. 1. Localized skin infection. 2. Central Nervous System Infection-This can be in the form of Meningitis, which can be deadly. 3. Epidural Infections-This can be in the form of an epidural abscess, which can cause pressure inside of the spine, causing compression of the spinal cord with subsequent paralysis. This would require an emergency surgery to decompress, and there are no guarantees that the patient would recover from the paralysis. 4. Discitis-This is an infection of the intervertebral discs.  It occurs in about 1% of discography procedures.  It is difficult to treat and it may lead to surgery.        2. Pain: the needles have to go through skin and soft tissues, will cause soreness.       3. Damage to internal structures:  The nerves to be lesioned may be near blood vessels or    other nerves which can be potentially damaged.       4. Bleeding: Bleeding is more common if the patient is taking blood thinners such as  aspirin, Coumadin, Ticiid, Plavix, etc., or if he/she have some genetic predisposition  such as hemophilia. Bleeding into the spinal canal can cause compression of the spinal  cord with subsequent paralysis.  This would  require an emergency surgery to  decompress and there are no guarantees that the patient would recover from the  paralysis.       5. Pneumothorax:  Puncturing of a lung is a possibility, every time a needle is introduced in  the area of the chest or upper back.  Pneumothorax refers to free air around the  collapsed lung(s), inside of the thoracic cavity (chest cavity).  Another two possible  complications related to a similar event would include: Hemothorax and Chylothorax.   These are variations of the Pneumothorax, where instead of air around the collapsed  lung(s), you may have blood or chyle, respectively.       6. Spinal headaches: They may occur with any procedures in the area of the spine.       7. Persistent CSF (Cerebro-Spinal Fluid) leakage: This is a rare problem, but may occur  with prolonged intrathecal or epidural catheters either due to the formation of a fistulous  track or a dural tear.       8. Nerve damage: By working so close to the spinal cord, there is always a possibility of  nerve damage, which could be as serious as  a permanent spinal cord injury with  paralysis.       9. Death:  Although rare, severe deadly allergic reactions known as "Anaphylactic  reaction" can occur to any of the medications used.      10. Worsening of the symptoms:  We can always make thing worse.  What are the chances of something like this happening? Chances of any of this occuring are extremely low.  By statistics, you have more of a chance of getting killed in a motor vehicle accident: while driving to the hospital than any of the above occurring .  Nevertheless, you should be aware that they are possibilities.  In general, it is similar to taking a shower.  Everybody knows that you can slip, hit your head and get killed.  Does that mean that you should not shower again?  Nevertheless always keep in mind that statistics do not mean anything if you happen to be on the wrong side of them.  Even if a procedure  has a 1 (one) in a 1,000,000 (million) chance of going wrong, it you happen to be that one..Also, keep in mind that by statistics, you have more of a chance of having something go wrong when taking medications.  Who should not have this procedure? If you are on a blood thinning medication (e.g. Coumadin, Plavix, see list of "Blood Thinners"), or if you have an active infection going on, you should not have the procedure.  If you are taking any blood thinners, please inform your physician.  How should I prepare for this procedure?  Do not eat or drink anything at least six hours prior to the procedure.  Bring a driver with you .  It cannot be a taxi.  Come accompanied by an adult that can drive you back, and that is strong enough to help you if your legs get weak or numb from the local anesthetic.  Take all of your medicines the morning of the procedure with just enough water to swallow them.  If you have diabetes, make sure that you are scheduled to have your procedure done first thing in the morning, whenever possible.  If you have diabetes, take only half of your insulin dose and notify our nurse that you have done so as soon as you arrive at the clinic.  If you are diabetic, but only take blood sugar pills (oral hypoglycemic), then do not take them on the morning of your procedure.  You may take them after you have had the procedure.  Do not take aspirin or any aspirin-containing medications, at least eleven (11) days prior to the procedure.  They may prolong bleeding.  Wear loose fitting clothing that may be easy to take off and that you would not mind if it got stained with Betadine or blood.  Do not wear any jewelry or perfume  Remove any nail coloring.  It will interfere with some of our monitoring equipment.  NOTE: Remember that this is not meant to be interpreted as a complete list of all possible complications.  Unforeseen problems may occur.  BLOOD THINNERS The following  drugs contain aspirin or other products, which can cause increased bleeding during surgery and should not be taken for 2 weeks prior to and 1 week after surgery.  If you should need take something for relief of minor pain, you may take acetaminophen which is found in Tylenol,m Datril, Anacin-3 and Panadol. It is not blood thinner. The products listed below are.  Do  not take any of the products listed below in addition to any listed on your instruction sheet.  A.P.C or A.P.C with Codeine Codeine Phosphate Capsules #3 Ibuprofen Ridaura  ABC compound Congesprin Imuran rimadil  Advil Cope Indocin Robaxisal  Alka-Seltzer Effervescent Pain Reliever and Antacid Coricidin or Coricidin-D  Indomethacin Rufen  Alka-Seltzer plus Cold Medicine Cosprin Ketoprofen S-A-C Tablets  Anacin Analgesic Tablets or Capsules Coumadin Korlgesic Salflex  Anacin Extra Strength Analgesic tablets or capsules CP-2 Tablets Lanoril Salicylate  Anaprox Cuprimine Capsules Levenox Salocol  Anexsia-D Dalteparin Magan Salsalate  Anodynos Darvon compound Magnesium Salicylate Sine-off  Ansaid Dasin Capsules Magsal Sodium Salicylate  Anturane Depen Capsules Marnal Soma  APF Arthritis pain formula Dewitt's Pills Measurin Stanback  Argesic Dia-Gesic Meclofenamic Sulfinpyrazone  Arthritis Bayer Timed Release Aspirin Diclofenac Meclomen Sulindac  Arthritis pain formula Anacin Dicumarol Medipren Supac  Analgesic (Safety coated) Arthralgen Diffunasal Mefanamic Suprofen  Arthritis Strength Bufferin Dihydrocodeine Mepro Compound Suprol  Arthropan liquid Dopirydamole Methcarbomol with Aspirin Synalgos  ASA tablets/Enseals Disalcid Micrainin Tagament  Ascriptin Doan's Midol Talwin  Ascriptin A/D Dolene Mobidin Tanderil  Ascriptin Extra Strength Dolobid Moblgesic Ticlid  Ascriptin with Codeine Doloprin or Doloprin with Codeine Momentum Tolectin  Asperbuf Duoprin Mono-gesic Trendar  Aspergum Duradyne Motrin or Motrin IB Triminicin  Aspirin  plain, buffered or enteric coated Durasal Myochrisine Trigesic  Aspirin Suppositories Easprin Nalfon Trillsate  Aspirin with Codeine Ecotrin Regular or Extra Strength Naprosyn Uracel  Atromid-S Efficin Naproxen Ursinus  Auranofin Capsules Elmiron Neocylate Vanquish  Axotal Emagrin Norgesic Verin  Azathioprine Empirin or Empirin with Codeine Normiflo Vitamin E  Azolid Emprazil Nuprin Voltaren  Bayer Aspirin plain, buffered or children's or timed BC Tablets or powders Encaprin Orgaran Warfarin Sodium  Buff-a-Comp Enoxaparin Orudis Zorpin  Buff-a-Comp with Codeine Equegesic Os-Cal-Gesic   Buffaprin Excedrin plain, buffered or Extra Strength Oxalid   Bufferin Arthritis Strength Feldene Oxphenbutazone   Bufferin plain or Extra Strength Feldene Capsules Oxycodone with Aspirin   Bufferin with Codeine Fenoprofen Fenoprofen Pabalate or Pabalate-SF   Buffets II Flogesic Panagesic   Buffinol plain or Extra Strength Florinal or Florinal with Codeine Panwarfarin   Buf-Tabs Flurbiprofen Penicillamine   Butalbital Compound Four-way cold tablets Penicillin   Butazolidin Fragmin Pepto-Bismol   Carbenicillin Geminisyn Percodan   Carna Arthritis Reliever Geopen Persantine   Carprofen Gold's salt Persistin   Chloramphenicol Goody's Phenylbutazone   Chloromycetin Haltrain Piroxlcam   Clmetidine heparin Plaquenil   Cllnoril Hyco-pap Ponstel   Clofibrate Hydroxy chloroquine Propoxyphen         Before stopping any of these medications, be sure to consult the physician who ordered them.  Some, such as Coumadin (Warfarin) are ordered to prevent or treat serious conditions such as "deep thrombosis", "pumonary embolisms", and other heart problems.  The amount of time that you may need off of the medication may also vary with the medication and the reason for which you were taking it.  If you are taking any of these medications, please make sure you notify your pain physician before you undergo any  procedures.         Spinal Cord Stimulation Trial Information A spinal cord stimulation trial is a test to see whether a spinal cord stimulator reduces your pain. A spinal cord stimulator is a small device that is inserted (implanted) in your back. The stimulator has small wires (leads) that connect it to your spinal cord. The stimulator sends electrical pulses through the leads to the spinal cord. This can relieve pain. Settings for the  stimulator can be adjusted with a remote device to find the best pain control. Your health care provider may suggest a spinal cord stimulation trial if other treatments for long-lasting (chronic) pain have not worked for you. Spinal cord stimulation may be used to manage pain that is caused by:  Coronary artery disease or peripheral vascular disease.  Failed back surgery.  Phantom limb sensation.  Peripheral neuropathy.  Complex regional pain syndrome.  Other syndromes that involve chronic pain. For the trial, the stimulator is attached to your back instead of inserted under the skin. A trial period is usually 3-5 days, but this can vary among health care providers. After your trial period, you and your health care provider will discuss whether a permanent spinal cord stimulator is an option for you. The permanent stimulator may be an option depending on:  Whether the stimulator reduces your pain during the trial.  Whether the stimulator fits into your lifestyle.  Whether the cost of the stimulator is covered by your insurance. What are the risks? Generally, a spinal cord stimulation trial is safe. However, problems can occur, including:  Bleeding or pain at the insertion site of the leads.  Infection at the insertion site or around the leads.  Allergic reactions to medicines, devices, or dyes.  Damage to the skin, nerves, back muscles, or spinal cord where the leads are placed.  Inability to move the legs (paralysis).  Numbness in the  legs.  Inability to control when you urinate or have a bowel movement (incontinence).  Spinal fluid leakage. How is a spinal cord stimulator placed for a trial?  For a trial period, the stimulator is placed on your skin, not under it. Only the leads that connect the stimulator to the spinal cord are implanted under your skin. The exact location of the stimulator depends on where you have pain. There are two types of surgery for implanting the leads:  Noninvasive surgery. In this type of surgery, a small incision is made and needles are used to place the leads under your skin.  Open surgery. In this type of surgery, a larger incision is made, and the leads are implanted directly into your back. How should I care for myself during the trial period? Activity  Return to your normal activities as told by your health care provider. Ask your health care provider what activities are safe for you.  Do not lift anything that is heavier than 10 lb (4.5 kg), or the limit that you are told. General instructions  Follow your health care provider's specific instructions about how to take care of your spinal cord stimulator and your incision.  Make sure you write down the following information so that you can share this information with your health care provider: ? Your responses to the stimulator. Describe these as told by your health care provider. ? Your pain level throughout the day. ? The amount and kind of pain medicine that you take.  Take over-the-counter and prescription medicines only as told by your health care provider.  Do not take baths, swim, or use a hot tub until your health care provider approves.  Tell all health care providers who provide care for you that you have a spinal cord stimulator. This is important information that could affect the medical treatment that you receive.  Keep all follow-up visits as told by your health care provider. This is important. When should I seek  medical care? During the trial, seek medical care if:  You  have redness, swelling, or pain around your incision.  You have fluid or blood coming from your incision.  Your incision feels warm to the touch.  You have pus or a bad smell coming from your incision.  The bandage (dressing) that covers your incision comes off. Get help right away if:  Your pain gets worse.  The stimulator leads come out.  You develop numbness or weakness in your legs, or you have difficulty walking.  You have problems urinating or having a bowel movement.  You have a fever.  You have symptoms that last for more than 2-3 days.  Your symptoms suddenly get worse. Summary  A spinal cord stimulator is a small device that sends electrical pulses to your spinal cord. This can relieve pain caused by many different health conditions.  Before a permanent stimulator is placed, you will have a trial using a temporary stimulator that is not implanted under your skin. This helps determine if a stimulator will reduce your pain.  For the trial, only the leads that connect the stimulator to the spinal cord are implanted under your skin.  During the trial period, make sure you write down information about your pain and your responses to the stimulator so that you can share this information with your health care provider.  Keep all follow-up visits as told by your health care provider. This is important. Contact your health care provider if you have symptoms that indicate a problem. This information is not intended to replace advice given to you by your health care provider. Make sure you discuss any questions you have with your health care provider. Document Revised: 04/24/2019 Document Reviewed: 09/03/2018 Elsevier Patient Education  [place].

## 2020-03-22 NOTE — Progress Notes (Signed)
Safety precautions to be maintained throughout the outpatient stay will include: orient to surroundings, keep bed in low position, maintain call bell within reach at all times, provide assistance with transfer out of bed and ambulation.  

## 2020-03-22 NOTE — Progress Notes (Addendum)
Patient: Dalton Villarreal  Service Category: E/M  Provider: Gillis Santa, MD  DOB: 1945-11-08  DOS: 03/22/2020  Referring Provider: Meade Maw, MD  MRN: [id number]  Setting: Ambulatory outpatient  PCP: Donnie Coffin, MD  Type: New Patient  Specialty: Interventional Pain Management    Location: Office  Delivery: Face-to-face     Primary Reason(s) for Visit: Encounter for initial evaluation of one or more chronic problems (new to examiner) potentially causing chronic pain, and posing a threat to normal musculoskeletal function. (Level of risk: High) CC: Back Pain (lower)  HPI  Dalton Villarreal is a 74 y.o. year old, male patient, who comes for the first time to our practice referred by Meade Maw, MD [place],  [place] [address], for our initial evaluation of his chronic pain. He has Malignant neoplasm of colon ([place]); Cancer ([place]); Colon cancer ([place]); Family history of cancer; Lumbar post-laminectomy syndrome; Spinal stenosis, lumbar region, with neurogenic claudication; Lumbar facet arthropathy; Chronic radicular lumbar pain; Lumbar degenerative disc disease; and Chronic pain syndrome on their problem list. Today he comes in for evaluation of his Back Pain (lower)  Pain Assessment: Location: Lower Back Radiating: denies; tho c/o numbness and tingling in feet/toes bilat;; no pain in upper legs Onset: More than a month ago Duration: Chronic pain Quality: Numbness, Tingling, Aching Severity: 10-Worst pain ever/10 (subjective, self-reported pain score)  Effect on ADL: limits daily activities Timing: Constant Modifying factors: meds BP: (!) 143/94  HR: 78  Onset and Duration: 2014 Cause of pain: Unknown Severity: Getting worse, NAS-11 at its worse: 10/10, NAS-11 at its best: 10/10 and NAS-11 on the average: 10/10 Timing: Morning, Afternoon, Night and During activity or exercise Aggravating Factors: Bending, Climbing, Kneeling, Lifiting, Prolonged sitting, Prolonged  standing, Squatting, Stooping , Surgery made it worse, Twisting, Walking, Walking uphill, Walking downhill and Working Alleviating Factors: Lying down Associated Problems: Numbness, Sweating and Swelling Quality of Pain: Aching, Disabling and Uncomfortable Previous Examinations or Tests: Biopsy, CT scan, MRI scan, Nerve block and Orthopedic evaluation Previous Treatments: The patient denies none listed   74 year old male with a history of low back pain with radiation to right buttock right posterior lateral thigh, posterior calf and the dorsum of his foot.  Patient had prior lumbar spine surgery in 2015 (x2) with progressive symptoms that are making it difficult for him to ambulate and perform ADLs.This is consistent with L5/S1 polyradiculopathy for which he has had right L5-S1 epidural steroid injection on 2 occasions which were not effective.  Patient has occasional left leg pain with walking and standing.    Patient was previously incarcerated for some period of time is now interested in getting relief for his low back and right leg pain.  Patient has participated in home health physical therapy.  He has seen Dr. Cari Caraway with neurosurgery on 2 occasions.  Patient is not interested in lumbar spine fusion so was referred here for spinal cord stimulator trial consideration.  Patient is on gabapentin, aspirin 81 mg, Flexeril 10 mg 3 times daily as needed along with naproxen 500 mg daily as needed.  He has tried oxycodone in the past which was not effective for his pain.  We will be focusing on interventional pain therapies primarily spinal cord stimulator trial.  Medication management to continue with primary care provider.    Meds   Current Outpatient Medications:  .  acetaminophen (TYLENOL) 500 MG tablet, Take 500 mg by mouth every 6 (six) hours as needed., Disp: , Rfl:  .  ASPIRIN LOW DOSE 81 MG EC tablet, Take 81 mg by mouth daily. , Disp: , Rfl:  .  atorvastatin (LIPITOR) 40 MG tablet,  Take 40 mg by mouth at bedtime. , Disp: , Rfl:  .  cyclobenzaprine (FLEXERIL) 10 MG tablet, Take 1 tablet (10 mg total) by mouth 3 (three) times daily as needed for muscle spasms., Disp: 30 tablet, Rfl: 0 .  enalapril (VASOTEC) 20 MG tablet, Take 20 mg by mouth 2 (two) times daily. , Disp: , Rfl:  .  gabapentin (NEURONTIN) 300 MG capsule, Take 300 mg by mouth 3 (three) times daily. , Disp: , Rfl:  .  hydrochlorothiazide (HYDRODIURIL) 25 MG tablet, Take 25 mg by mouth daily. , Disp: , Rfl:  .  metFORMIN (GLUCOPHAGE-XR) 500 MG 24 hr tablet, Take 500 mg by mouth daily with breakfast. , Disp: , Rfl:  .  metoprolol tartrate (LOPRESSOR) 25 MG tablet, Take 25 mg by mouth 2 (two) times daily. , Disp: , Rfl:  .  naproxen (NAPROSYN) 500 MG tablet, Take 500 mg by mouth daily., Disp: , Rfl:  .  Omega-3 Fatty Acids (FISH OIL) 1000 MG CAPS, Take by mouth., Disp: , Rfl:  .  potassium chloride (KLOR-CON) 8 MEQ tablet, Take 8 mEq by mouth daily. , Disp: , Rfl:  .  sildenafil (VIAGRA) 100 MG tablet, Take by mouth., Disp: , Rfl:   Imaging Review    MR THORACIC SPINE WO CONTRAST  Narrative CLINICAL DATA:  74 year old male with chronic mid and lower back pain radiating anteriorly and to the legs. Bilateral leg numbness.  EXAM: MRI THORACIC SPINE WITHOUT CONTRAST  TECHNIQUE: Multiplanar, multisequence MR imaging of the thoracic spine was performed. No intravenous contrast was administered.  COMPARISON:  Lumbar MRI 07/22/2019.  FINDINGS: Limited cervical spine imaging: Widespread advanced cervical disc and endplate degeneration suspected, likely with multilevel significant cervical spinal stenosis (series 16, image 5).  Thoracic spine segmentation:  Appears to be normal.  Alignment: Relatively normal thoracic kyphosis. No spondylolisthesis. No scoliosis.  Vertebrae: No marrow edema or evidence of acute osseous abnormality. Visualized bone marrow signal is within normal limits. Preserved thoracic  vertebral height.  Cord: No thoracic spinal cord signal abnormality. Generally capacious thoracic spinal canal. The conus medullaris is visible at T12-L1 and appears normal.  Paraspinal and other soft tissues: Negative visible thoracic and upper abdominal viscera; chronic benign appearing left renal cyst. Negative thoracic paraspinal soft tissues.  Disc levels:  C7-T1: Disc space loss with bulky posterior and rightward disc (series 17, image 7) resulting in mild spinal stenosis. No definite cord mass effect (series 22, image 2). There is moderate to severe C8 foraminal stenosis which appears greater on the right.  T1-T2: Circumferential disc bulge and mild posterior element hypertrophy. No spinal stenosis. Moderate to severe T1 foraminal stenosis perhaps greater on the right.  T2-T3: Mild disc bulge. Posterior element hypertrophy greater on the left. No spinal stenosis. Moderate to severe left T2 foraminal stenosis.  T3-T4: Mild disc bulge. Posterior element hypertrophy. Mild bilateral T3 foraminal stenosis.  T4-T5: Minimal disc bulge. Posterior element hypertrophy greater on the right. Moderate right T4 foraminal stenosis.  T5-T6: Mild disc bulge.  No stenosis.  T6-T7: Mild disc bulge.  No stenosis.  T7-T8: Circumferential disc bulge with broad-based posterior and right foraminal component. No spinal stenosis. Borderline to mild right T7 foraminal stenosis.  T8-T9: Circumferential disc bulge. Posterior element hypertrophy. No spinal stenosis. Mild left T8 foraminal stenosis.  T9-T10: Mild facet hypertrophy.  No  stenosis.  T10-T11: Mild facet hypertrophy.  No stenosis.  T11-T12: Mild to moderate facet hypertrophy. Mild bilateral T11 foraminal stenosis.  T12-L1: Negative.  IMPRESSION: 1. No acute osseous abnormality in the thoracic spine.  2. Widespread thoracic disc bulging and posterior element degeneration, but no thoracic spinal stenosis. There is Moderate  or severe neural foraminal stenosis at the bilateral T1, left T2, and right T4 nerve levels.  3. 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   Electronically Signed By: Genevie Ann M.D. On: 02/17/2020 14:45    Narrative CLINICAL DATA:  Low back pain with right leg pain. Numbness and balance disturbance.  EXAM: MRI LUMBAR SPINE WITHOUT AND WITH CONTRAST  TECHNIQUE: Multiplanar and multiecho pulse sequences of the lumbar spine were obtained without and with intravenous contrast.  CONTRAST:  7.66m GADAVIST GADOBUTROL 1 MMOL/ML IV SOLN  COMPARISON:  07/18/2015  FINDINGS: Segmentation: 5 lumbar type vertebral bodies as numbered previously.  Alignment: Development of 4 mm of degenerative anterolisthesis at L4-5.  Vertebrae:  No fracture or primary bone lesion.  Conus medullaris and cauda equina: Conus extends to the L1 level. Conus and cauda equina appear normal.  Paraspinal and other soft tissues: Distended bladder. Left renal cyst.  Disc levels:  No abnormality at L2-3 or above.  L3-4: Disc degeneration with shallow disc protrusion. Facet and ligamentous hypertrophy. Stenosis of lateral recesses that could cause neural compression on either or both sides. Worsening since 2016.  L4-5: Advanced bilateral facet arthropathy now with 4 mm of anterolisthesis. Broad-based disc herniation. Previous posterior decompression. Stenosis of the lateral recesses and neural foramina that could cause neural compression on either or both sides.  L5-S1: Previous posterior decompression. Disc space narrowing. Endplate osteophytes and bulging of the disc. Facet hypertrophy. Stenosis of both subarticular lateral recesses that could cause neural compression on either or both sides. Bilateral foraminal narrowing as well. 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  IMPRESSION: L3-4: Shallow disc  protrusion. Facet and ligamentous hypertrophy. Stenosis of both lateral recesses that could be symptomatic. Worsened since 2016.  L4-5: Previous posterior decompression and discectomy. Worsened facet arthropathy with anterolisthesis 4 mm presently. Broad-based disc protrusion. Stenosis of the lateral recesses and neural foramina likely to cause neural compression.  L5-S1: Previous posterior decompression and discectomy. Endplate osteophytes and bulging of the disc. Stenosis of the lateral recesses and neural foramina that could be symptomatic. Arachnoiditis pattern also noted at this level.   Electronically Signed By: MNelson ChimesM.D. On: 07/22/2019 16:49     Complexity Note: Imaging results reviewed. Results shared with Mr. WAbalos using Layman's terms.                         ROS  Cardiovascular: Daily Aspirin intake, High blood pressure and Blood thinners:  Antiplatelet Pulmonary or Respiratory: Snoring  Neurological: No reported neurological signs or symptoms such as seizures, abnormal skin sensations, urinary and/or fecal incontinence, being born with an abnormal open spine and/or a tethered spinal cord Psychological-Psychiatric: No reported psychological or psychiatric signs or symptoms such as difficulty sleeping, anxiety, depression, delusions or hallucinations (schizophrenial), mood swings (bipolar disorders) or suicidal ideations or attempts Gastrointestinal: Reflux or heatburn and Irregular, infrequent bowel movements (Constipation) Genitourinary: No reported renal or genitourinary signs or symptoms such as difficulty voiding or producing urine, peeing blood, non-functioning kidney, kidney stones, difficulty emptying the bladder, difficulty controlling the flow of urine, or chronic  kidney disease Hematological: Weakness due to low blood hemoglobin or red blood cell count (Anemia) and No reported hematological signs or symptoms such as prolonged bleeding, low or poor  functioning platelets, bruising or bleeding easily, hereditary bleeding problems, low energy levels due to low hemoglobin or being anemic Endocrine: High blood sugar 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 Mccorkel has No Known Allergies.  Laboratory Chemistry Profile   Renal Lab Results  Component Value Date   BUN 16 03/04/2020   CREATININE 1.03 03/04/2020   GFRAA >60 03/04/2020   GFRNONAA >60 03/04/2020   PROTEINUR Negative 05/21/2012     Electrolytes Lab Results  Component Value Date   NA 138 03/04/2020   K 3.2 (L) 03/04/2020   CL 102 03/04/2020   CALCIUM 9.1 03/04/2020   MG 2.1 05/27/2019   PHOS 3.2 05/27/2019     Hepatic Lab Results  Component Value Date   AST 37 03/04/2020   ALT 27 03/04/2020   ALBUMIN 4.2 03/04/2020   ALKPHOS 57 03/04/2020     ID Lab Results  Component Value Date   SARSCOV2NAA NEGATIVE 05/22/2019     Bone No results found for: VD25OH, VO536UY4IHK, VQ2595GL8, VF6433IR5, 25OHVITD1, 25OHVITD2, 25OHVITD3, TESTOFREE, TESTOSTERONE   Endocrine Lab Results  Component Value Date   GLUCOSE 92 03/04/2020   GLUCOSEU Negative 05/21/2012   HGBA1C 6.1 (H) 05/26/2019     Neuropathy Lab Results  Component Value Date   HGBA1C 6.1 (H) 05/26/2019     CNS No results found for: COLORCSF, APPEARCSF, RBCCOUNTCSF, WBCCSF, POLYSCSF, LYMPHSCSF, EOSCSF, PROTEINCSF, GLUCCSF, JCVIRUS, CSFOLI, IGGCSF, LABACHR, ACETBL, LABACHR, ACETBL   Inflammation (CRP: Acute  ESR: Chronic) No results found for: CRP, ESRSEDRATE, LATICACIDVEN   Rheumatology No results found for: RF, ANA, LABURIC, URICUR, LYMEIGGIGMAB,  LYMEABIGMQN, HLAB27   Coagulation Lab Results  Component Value Date   PLT 160 03/04/2020     Cardiovascular Lab Results  Component Value Date   CKTOTAL 576 (H) 05/21/2012   CKMB 5.7 (H) 05/21/2012   TROPONINI < 0.02 05/22/2012   HGB 13.6 03/04/2020   HCT 38.3 (L) 03/04/2020     Screening Lab Results  Component Value Date   SARSCOV2NAA NEGATIVE 05/22/2019     Cancer No results found for: CEA, CA125, LABCA2   Allergens No results found for: ALMOND, APPLE, ASPARAGUS, AVOCADO, BANANA, BARLEY, BASIL, BAYLEAF, GREENBEAN, LIMABEAN, WHITEBEAN, BEEFIGE, REDBEET, BLUEBERRY, BROCCOLI, CABBAGE, MELON, CARROT, CASEIN, CASHEWNUT, CAULIFLOWER, CELERY     Note: Lab results reviewed.  [place]  Drug: Mr. Safley  reports no history of drug use. Alcohol:  reports current alcohol use. Tobacco:  reports that he has never smoked. He has never used smokeless tobacco. Medical:  has a past medical history of Cancer ([place]), Diabetes mellitus without complication ([place]), Hypertension, and Ruptured lumbar disc. Family: family history includes Breast cancer in his mother; Prostate cancer in his brother.  Past Surgical History:  Procedure Laterality Date  . COLONOSCOPY    . COLONOSCOPY WITH PROPOFOL N/A 04/21/2019   Procedure: COLONOSCOPY WITH PROPOFOL;  Surgeon: [name], MD;  Location: [facility] ENDOSCOPY;  Service: Gastroenterology;  Laterality: N/A;  . EYE SURGERY Bilateral    cataracts bilaterally  . LAPAROSCOPIC RIGHT COLECTOMY N/A 05/26/2019  Procedure: LAPAROSCOPIC RIGHT COLECTOMY;  Surgeon: Jules Husbands, MD;  Location: [facility] ORS;  Service: General;  Laterality: N/A;  . LUMBAR LAMINECTOMY    . UPPER GI ENDOSCOPY     Active Ambulatory Problems    Diagnosis Date Noted  . Malignant neoplasm of colon ([place]) 05/01/2019  . Cancer ([place])   . Colon cancer ([place]) 05/26/2019  . Family history of cancer 06/13/2019  . Lumbar post-laminectomy syndrome 03/22/2020  . Spinal stenosis, lumbar region, with  neurogenic claudication 03/22/2020  . Lumbar facet arthropathy 03/22/2020  . Chronic radicular lumbar pain 03/22/2020  . Lumbar degenerative disc disease 03/22/2020  . Chronic pain syndrome 03/22/2020   Resolved Ambulatory Problems    Diagnosis Date Noted  . No Resolved Ambulatory Problems   Past Medical History:  Diagnosis Date  . Diabetes mellitus without complication ([place])   . Hypertension   . Ruptured lumbar disc    Constitutional Exam  General appearance: Well nourished, well developed, and well hydrated. In no apparent acute distress Vitals:   03/22/20 0903  BP: (!) 143/94  Pulse: 78  Resp: 16  Temp: (!) 97.2 F (36.2 C)  TempSrc: Temporal  SpO2: 100%  Weight: 169 lb 3.2 oz (76.7 kg)  Height: _0  (1.626 m)   BMI Assessment: Estimated body mass index is 29.04 kg/m as calculated from the following:   Height as of this encounter: _1  (1.626 m).   Weight as of this encounter: 169 lb 3.2 oz (76.7 kg).  BMI interpretation table: BMI level Category Range association with higher incidence of chronic pain  <18 kg/m2 Underweight   18.5-24.9 kg/m2 Ideal body weight   25-29.9 kg/m2 Overweight Increased incidence by 20%  30-34.9 kg/m2 Obese (Class I) Increased incidence by 68%  35-39.9 kg/m2 Severe obesity (Class II) Increased incidence by 136%  >40 kg/m2 Extreme obesity (Class III) Increased incidence by 254%   Patient's current BMI Ideal Body weight  Body mass index is 29.04 kg/m. Ideal body weight: 59.2 kg (130 lb 8.2 oz) Adjusted ideal body weight: 66.2 kg (145 lb 15.8 oz)   BMI Readings from Last 4 Encounters:  03/22/20 29.04 kg/m  03/04/20 29.08 kg/m  09/08/19 29.76 kg/m  07/13/19 30.79 kg/m   Wt Readings from Last 4 Encounters:  03/22/20 169 lb 3.2 oz (76.7 kg)  03/04/20 169 lb 6.4 oz (76.8 kg)  09/08/19 173 lb 6.4 oz (78.7 kg)  07/13/19 179 lb 6.4 oz (81.4 kg)    Psych/Mental status: Alert, oriented x 3 (person, place, & time)       Eyes:  PERLA Respiratory: No evidence of acute respiratory distress  Cervical Spine Exam  Skin & Axial Inspection: No masses, redness, edema, swelling, or associated skin lesions Alignment: Symmetrical Functional ROM: Unrestricted ROM      Stability: No instability detected Muscle Tone/Strength: Functionally intact. No obvious neuro-muscular anomalies detected. Sensory (Neurological): Unimpaired Palpation: No palpable anomalies              Upper Extremity (UE) Exam    Side: Right upper extremity  Side: Left upper extremity  Skin & Extremity Inspection: Skin color, temperature, and hair growth are WNL. No peripheral edema or cyanosis. No masses, redness, swelling, asymmetry, or associated skin lesions. No contractures.  Skin & Extremity Inspection: Skin color, temperature, and hair growth are WNL. No peripheral edema or cyanosis. No masses, redness, swelling, asymmetry, or associated skin lesions. No contractures.  Functional ROM: Unrestricted ROM  Functional ROM: Unrestricted ROM          Muscle Tone/Strength: Functionally intact. No obvious neuro-muscular anomalies detected.   Muscle Tone/Strength: Functionally intact. No obvious neuro-muscular anomalies detected.  Sensory (Neurological): Unimpaired          Sensory (Neurological): Unimpaired          Palpation: No palpable anomalies              Palpation: No palpable anomalies              Provocative Test(s):  Phalen's test: deferred Tinel's test: deferred Apley's scratch test (touch opposite shoulder):  Action 1 (Across chest): deferred Action 2 (Overhead): deferred Action 3 (LB reach): deferred   Provocative Test(s):  Phalen's test: deferred Tinel's test: deferred Apley's scratch test (touch opposite shoulder):  Action 1 (Across chest): deferred Action 2 (Overhead): deferred Action 3 (LB reach): deferred    Thoracic Spine Area Exam  Skin & Axial Inspection: No masses, redness, or swelling Alignment:  Symmetrical Functional ROM: Unrestricted ROM Stability: No instability detected Muscle Tone/Strength: Functionally intact. No obvious neuro-muscular anomalies detected. Sensory (Neurological): Unimpaired Muscle strength & Tone: No palpable anomalies  Lumbar Exam  Skin & Axial Inspection: Well healed scar from previous spine surgery detected Alignment: Symmetrical Functional ROM: Pain restricted ROM       Stability: No instability detected Muscle Tone/Strength: Functionally intact. No obvious neuro-muscular anomalies detected. Sensory (Neurological): Dermatomal pain pattern and musculoskeletal Provocative Tests: Hyperextension/rotation test: (+) due to pain. Lumbar quadrant test (Kemp's test): (+) bilateral for foraminal stenosis   Gait & Posture Assessment  Ambulation: Patient ambulates using a cane Gait: Antalgic gait (limping) Posture: Difficulty standing up straight, due to pain   Lower Extremity Exam    Side: Right lower extremity  Side: Left lower extremity  Stability: No instability observed          Stability: No instability observed          Skin & Extremity Inspection: Skin color, temperature, and hair growth are WNL. No peripheral edema or cyanosis. No masses, redness, swelling, asymmetry, or associated skin lesions. No contractures.  Skin & Extremity Inspection: Skin color, temperature, and hair growth are WNL. No peripheral edema or cyanosis. No masses, redness, swelling, asymmetry, or associated skin lesions. No contractures.  Functional ROM: Pain restricted ROM for hip and knee joints          Functional ROM: Restricted ROM for hip and knee joints          Muscle Tone/Strength: Functionally intact. No obvious neuro-muscular anomalies detected.  Muscle Tone/Strength: Functionally intact. No obvious neuro-muscular anomalies detected.  Sensory (Neurological): Neurogenic pain pattern        Sensory (Neurological): Neurogenic pain pattern        DTR: Patellar: 1+:  trace Achilles: deferred today Plantar: deferred today  DTR: Patellar: 1+: trace Achilles: deferred today Plantar: deferred today  Palpation: No palpable anomalies  Palpation: No palpable anomalies   Assessment  Primary Diagnosis & Pertinent Problem List: The primary encounter diagnosis was Failed back surgical syndrome. Diagnoses of Lumbar post-laminectomy syndrome, Spinal stenosis, lumbar region, with neurogenic claudication, Lumbar facet arthropathy, Chronic radicular lumbar pain, Lumbar degenerative disc disease, and Chronic pain syndrome were also pertinent to this visit.  Visit Diagnosis (New problems to examiner): 1. Failed back surgical syndrome   2. Lumbar post-laminectomy syndrome   3. Spinal stenosis, lumbar region, with neurogenic claudication   4. Lumbar facet arthropathy   5. Chronic radicular lumbar  pain   6. Lumbar degenerative disc disease   7. Chronic pain syndrome     74 year old male who is very pleasant with a history of L4-L5 and L5-S1 posterior decompression and discectomy x2 in 2015 who presents with low back pain with radiation into his right hip, right posterior lateral thigh, right calf and right foot.  Patient has tried and failed conservative measures including medication management, transforaminal lumbar epidural steroid injection x2, physical therapy.  He is being referred here from neurosurgery, Dr. Cari Caraway for consideration of spinal cord stimulator trial as the patient wants to avoid an instrumented spinal fusion.  Risks and benefits of this procedure were discussed in great detail using a spine model.  Please see below.  Plan of Care (Initial workup plan)  I discussed  percutaneous spinal cord stimulator trial with the patient in detail. I explained to the patient that they will have an external power source and programmer which the patient will use for 7 days. There will be daily communication with the stimulator company and the patient. A possible need  for a mid-trial clinic visit to give the patient the best chance of success.   I was also able to evaluate the patient's interlaminar windows under live fluoroscopy in the patient's interlaminar windows appear patent for suitable percutaneous access for spinal cord stimulator trial.  Patient will need to have a thorough psychosocial behavioral evaluation. Our office will be happy to help facilitate this. Will place referral to Dr Carlis Abbott.  Some of patient's pain does seem to be mechanical in nature, with some component of neurogenic pain as well. We discussed the indications for spinal cord stimulation, specifically stating that it is typically better for neuropathic and appendicular pain, but that we have had some success in the treatment of low back and hip pain.   Patient is interested in proceeding with spinal cord stimulation trial. He understands that this may not be successful, and that spinal cord stimulation in general is not a "magic bullet."   We had a lengthy and very detailed discussion of all the risks, benefits, alternatives, and rationale of surgery as well as the option of continuing nonsurgical therapies. We specifically discussed the risks of temporary or permanent worsened neurologic injury, no symptomatic relief or pain made worse after procedure, and also the need for future surgery (due to infection, CSF leak, bleeding, adjacent segment issues, bone-healing difficulties, and other related issues). No guarantees of outcome were made or implied and he is eager to proceed and presents for definitive treatment.  Mr. Kolar told me that all of his questions were answered thoroughly and to his satisfaction. Confidence and understanding of the discussed risks and consequences of  treatment was expressed and he accepted these risks and was eager to proceed with procedure.   Issues concerning treatment and diagnosis were discussed with the patient. There are no barriers to understanding the  plan of treatment. Explanation was well received by patient and/or family who then verbalized understanding.   Plan for [place] SCS trial.  Imaging Orders     DG PAIN CLINIC C-ARM 1-60 MIN NO REPORT  Referral Orders     Ambulatory referral to Psychology  Procedure Orders     [place] TRIAL   Provider-requested follow-up: Return in about 15 days (around 04/06/2020) for [employer] SCS trial (2 hrs).  Future Appointments  Date Time Provider [place]  08/30/2020  9:45 AM [facility] None  08/30/2020 10:15 AM Earlie Server, MD [facility] None  Note by: Gillis Santa, MD Date: 03/22/2020; Time: 10:17 AM

## 2020-04-22 ENCOUNTER — Other Ambulatory Visit: Payer: Self-pay

## 2020-04-22 DIAGNOSIS — C182 Malignant neoplasm of ascending colon: Secondary | ICD-10-CM

## 2020-04-22 MED ORDER — NA SULFATE-K SULFATE-MG SULF 17.5-3.13-1.6 GM/177ML PO SOLN: 1.0000 | Freq: Once | ORAL | 0 refills | Status: AC

## 2020-05-04 ENCOUNTER — Other Ambulatory Visit: Admission: RE | Admit: 2020-05-04 | Payer: Medicaid Other | Source: Ambulatory Visit

## 2020-05-05 ENCOUNTER — Telehealth: Payer: Self-pay | Admitting: Student in an Organized Health Care Education/Training Program

## 2020-05-05 ENCOUNTER — Other Ambulatory Visit: Payer: Self-pay

## 2020-05-05 ENCOUNTER — Other Ambulatory Visit
Admission: RE | Admit: 2020-05-05 | Discharge: 2020-05-05 | Disposition: A | Discharge status: Home / Self Care | Payer: Medicaid Other | Source: Ambulatory Visit | Attending: Gastroenterology | Admitting: Gastroenterology

## 2020-05-05 DIAGNOSIS — Z20822 Contact with and (suspected) exposure to covid-19: Secondary | ICD-10-CM | POA: Diagnosis not present

## 2020-05-05 DIAGNOSIS — Z01812 Encounter for preprocedural laboratory examination: Secondary | ICD-10-CM | POA: Insufficient documentation

## 2020-05-05 DIAGNOSIS — M48062 Spinal stenosis, lumbar region with neurogenic claudication: Secondary | ICD-10-CM

## 2020-05-05 DIAGNOSIS — M961 Postlaminectomy syndrome, not elsewhere classified: Secondary | ICD-10-CM

## 2020-05-05 LAB — SARS CORONAVIRUS 2 (TAT 6-24 HRS): SARS Coronavirus 2: NEGATIVE

## 2020-05-05 NOTE — Telephone Encounter (Signed)
I spoke with Mr. Dalton Villarreal. He is ready to go with the appointment for SCS Trial. You can put the order in and who it needs to be scheduled with. I sent msg to Jocelyn Lamer to set up appointment.

## 2020-05-06 ENCOUNTER — Encounter: Payer: Self-pay | Admitting: Gastroenterology

## 2020-05-06 ENCOUNTER — Ambulatory Visit
Admission: RE | Admit: 2020-05-06 | Discharge: 2020-05-06 | Disposition: A | Discharge status: Home / Self Care | Payer: Medicaid Other | Attending: Gastroenterology | Admitting: Gastroenterology

## 2020-05-06 ENCOUNTER — Ambulatory Visit: Payer: Medicaid Other | Admitting: Certified Registered Nurse Anesthetist

## 2020-05-06 ENCOUNTER — Encounter
Admission: RE | Disposition: A | Discharge status: Home / Self Care | Payer: Self-pay | Source: Home / Self Care | Attending: Gastroenterology

## 2020-05-06 ENCOUNTER — Other Ambulatory Visit: Payer: Self-pay

## 2020-05-06 DIAGNOSIS — Z1211 Encounter for screening for malignant neoplasm of colon: Secondary | ICD-10-CM | POA: Diagnosis not present

## 2020-05-06 DIAGNOSIS — Z85038 Personal history of other malignant neoplasm of large intestine: Secondary | ICD-10-CM | POA: Diagnosis not present

## 2020-05-06 DIAGNOSIS — E7800 Pure hypercholesterolemia, unspecified: Secondary | ICD-10-CM | POA: Diagnosis not present

## 2020-05-06 DIAGNOSIS — Z791 Long term (current) use of non-steroidal anti-inflammatories (NSAID): Secondary | ICD-10-CM | POA: Diagnosis not present

## 2020-05-06 DIAGNOSIS — Z8042 Family history of malignant neoplasm of prostate: Secondary | ICD-10-CM | POA: Diagnosis not present

## 2020-05-06 DIAGNOSIS — I10 Essential (primary) hypertension: Secondary | ICD-10-CM | POA: Insufficient documentation

## 2020-05-06 DIAGNOSIS — Z7982 Long term (current) use of aspirin: Secondary | ICD-10-CM | POA: Diagnosis not present

## 2020-05-06 DIAGNOSIS — C182 Malignant neoplasm of ascending colon: Secondary | ICD-10-CM | POA: Diagnosis not present

## 2020-05-06 DIAGNOSIS — Z803 Family history of malignant neoplasm of breast: Secondary | ICD-10-CM | POA: Insufficient documentation

## 2020-05-06 DIAGNOSIS — Z980 Intestinal bypass and anastomosis status: Secondary | ICD-10-CM | POA: Diagnosis not present

## 2020-05-06 DIAGNOSIS — M1990 Unspecified osteoarthritis, unspecified site: Secondary | ICD-10-CM | POA: Insufficient documentation

## 2020-05-06 DIAGNOSIS — Z79899 Other long term (current) drug therapy: Secondary | ICD-10-CM | POA: Diagnosis not present

## 2020-05-06 DIAGNOSIS — Z7984 Long term (current) use of oral hypoglycemic drugs: Secondary | ICD-10-CM | POA: Diagnosis not present

## 2020-05-06 DIAGNOSIS — E119 Type 2 diabetes mellitus without complications: Secondary | ICD-10-CM | POA: Diagnosis not present

## 2020-05-06 HISTORY — PX: COLONOSCOPY WITH PROPOFOL: SHX5780

## 2020-05-06 HISTORY — DX: Pure hypercholesterolemia, unspecified: E78.00

## 2020-05-06 LAB — GLUCOSE, CAPILLARY: Glucose-Capillary: 118 mg/dL — ABNORMAL HIGH (ref 70–99)

## 2020-05-06 SURGERY — COLONOSCOPY WITH PROPOFOL
Anesthesia: General

## 2020-05-06 MED ORDER — PROPOFOL 500 MG/50ML IV EMUL
INTRAVENOUS | Status: AC
  Filled 2020-05-06: qty 50

## 2020-05-06 MED ORDER — LIDOCAINE HCL (PF) 2 % IJ SOLN
INTRAMUSCULAR | Status: AC
  Filled 2020-05-06: qty 5

## 2020-05-06 MED ORDER — PROPOFOL 10 MG/ML IV BOLUS
INTRAVENOUS | Status: DC | PRN
  Administered 2020-05-06: 60 mg via INTRAVENOUS

## 2020-05-06 MED ORDER — PROPOFOL 500 MG/50ML IV EMUL
INTRAVENOUS | Status: DC | PRN
  Administered 2020-05-06: 180 ug/kg/min via INTRAVENOUS

## 2020-05-06 MED ORDER — LIDOCAINE HCL (CARDIAC) PF 100 MG/5ML IV SOSY
PREFILLED_SYRINGE | INTRAVENOUS | Status: DC | PRN
  Administered 2020-05-06: 50 mg via INTRAVENOUS

## 2020-05-06 MED ORDER — SODIUM CHLORIDE 0.9 % IV SOLN: INTRAVENOUS | Status: DC

## 2020-05-06 NOTE — Anesthesia Preprocedure Evaluation (Signed)
Anesthesia Evaluation  Patient identified by MRN, date of birth, ID band Patient awake    Reviewed: Allergy & Precautions, NPO status , Patient's Chart, lab work & pertinent test results, reviewed documented beta blocker date and time   History of Anesthesia Complications Negative for: history of anesthetic complications  Airway Mallampati: II  TM Distance: >3 FB     Dental  (+) Chipped, Edentulous Upper, Dental Advidsory Given   Pulmonary neg pulmonary ROS,    Pulmonary exam normal        Cardiovascular hypertension, Pt. on medications and Pt. on home beta blockers (-) angina(-) Past MI and (-) Cardiac Stents Normal cardiovascular exam(-) dysrhythmias (-) Valvular Problems/Murmurs     Neuro/Psych negative neurological ROS  negative psych ROS   GI/Hepatic negative GI ROS, Neg liver ROS,   Endo/Other  diabetes, Type 2  Renal/GU negative Renal ROS  negative genitourinary   Musculoskeletal  (+) Arthritis , Osteoarthritis,    Abdominal Normal abdominal exam  (+)   Peds negative pediatric ROS (+)  Hematology negative hematology ROS (+)   Anesthesia Other Findings Past Medical History: No date: Cancer ([place])     Comment:  colon cancer No date: Diabetes mellitus without complication ([place]) No date: Hypercholesteremia No date: Hypertension No date: Ruptured lumbar disc   Reproductive/Obstetrics                             Anesthesia Physical  Anesthesia Plan  ASA: III  Anesthesia Plan: General   Post-op Pain Management:    Induction: Intravenous  PONV Risk Score and Plan: 2 and Propofol infusion and TIVA  Airway Management Planned: Natural Airway and Nasal Cannula  Additional Equipment:   Intra-op Plan:   Post-operative Plan:   Informed Consent: I have reviewed the patients History and Physical, chart, labs and discussed the procedure including the risks, benefits and  alternatives for the proposed anesthesia with the patient or authorized representative who has indicated his/her understanding and acceptance.     Dental advisory given  Plan Discussed with: CRNA  Anesthesia Plan Comments:         Anesthesia Quick Evaluation

## 2020-05-06 NOTE — OR Nursing (Signed)
Only got to anastomosis site.

## 2020-05-06 NOTE — Transfer of Care (Signed)
Immediate Anesthesia Transfer of Care Note  Patient: Dalton Villarreal  Procedure(s) Performed: COLONOSCOPY WITH PROPOFOL (N/A )  Patient Location: PACU  Anesthesia Type:General  Level of Consciousness: drowsy  Airway & Oxygen Therapy: Patient Spontanous Breathing  Post-op Assessment: Report given to RN and Post -op Vital signs reviewed and stable  Post vital signs: Reviewed and stable  Last Vitals:  Vitals Value Taken Time  BP 92/61 05/06/20 1046  Temp    Pulse 80 05/06/20 1047  Resp 12 05/06/20 1047  SpO2 97 % 05/06/20 1047  Vitals shown include unvalidated device data.  Last Pain:  Vitals:   05/06/20 1046  TempSrc:   PainSc: 0-No pain         Complications: No complications documented.

## 2020-05-06 NOTE — Op Note (Signed)
[facility] Gastroenterology Patient Name: Dalton Villarreal Procedure Date: 05/06/2020 10:28 AM MRN: [id number] Account #: [id number] Date of Birth: 03/20/1946 Admit Type: Outpatient Age: 74 Room: [facility] ENDO ROOM 4 Gender: Male Note Status: Finalized Procedure:             Colonoscopy Indications:           High risk colon cancer surveillance: Personal history                         of colon cancer Providers:             [name] MD, MD Referring MD:          Edmonia Lynch. Aycock MD (Referring MD) Medicines:             Monitored Anesthesia Care Complications:         No immediate complications. Procedure:             Pre-Anesthesia Assessment:                        - Prior to the procedure, a History and Physical was                         performed, and patient medications, allergies and                         sensitivities were reviewed. The patient's tolerance                         of previous anesthesia was reviewed.                        - The risks and benefits of the procedure and the                         sedation options and risks were discussed with the                         patient. All questions were answered and informed                         consent was obtained.                        - ASA Grade Assessment: II - A patient with mild                         systemic disease.                        After obtaining informed consent, the colonoscope was                         passed under direct vision. Throughout the procedure,                         the patient's blood pressure, pulse, and oxygen                         saturations were monitored continuously. The  Colonoscope was introduced through the anus and                         advanced to the the ileocolonic anastomosis. The                         colonoscopy was performed with ease. The patient                         tolerated the procedure well. The quality  of the bowel                         preparation was excellent. Findings:      The perianal and digital rectal examinations were normal.      There was evidence of a prior end-to-end ileo-colonic anastomosis in the       ascending colon. This was characterized by healthy appearing mucosa.      The exam was otherwise without abnormality on direct and retroflexion       views. Impression:            - End-to-end ileo-colonic anastomosis, characterized                         by healthy appearing mucosa.                        - The examination was otherwise normal on direct and                         retroflexion views.                        - No specimens collected. Recommendation:        - Discharge patient to home (with escort).                        - Resume previous diet.                        - Continue present medications.                        - Repeat colonoscopy in 3 years for surveillance. Procedure Code(s):     --- Professional ---                        [phone], Colonoscopy, flexible; diagnostic, including                         collection of specimen(s) by brushing or washing, when                         performed (separate procedure) Diagnosis Code(s):     --- Professional ---                        A21.308, Personal history of other malignant neoplasm                         of large intestine  Z98.0, Intestinal bypass and anastomosis status CPT copyright 2019 American Medical Association. All rights reserved. The codes documented in this report are preliminary and upon coder review may  be revised to meet current compliance requirements. [name], MD [name] MD, MD 05/06/2020 10:44:43 AM This report has been signed electronically. Number of Addenda: 0 Note Initiated On: 05/06/2020 10:28 AM Scope Withdrawal Time: 0 hours 8 minutes 25 seconds  Total Procedure Duration: 0 hours 10 minutes 10 seconds  Estimated Blood Loss:  Estimated blood  loss: none.      [facility]

## 2020-05-06 NOTE — Telephone Encounter (Signed)
I have not received authorization for this yet. Jocelyn Lamer will schedule once I get the prior auth.

## 2020-05-06 NOTE — H&P (Signed)
Dalton Bellows, MD [address] [place], [place], [place], [address] [address], [place], [place], [place], [address] Phone: [phone]  Fax: [phone]  Primary Care Physician:  Donnie Coffin, MD   Pre-Procedure History & Physical: HPI:  Dalton Villarreal is a 74 y.o. male is here for an colonoscopy.   Past Medical History:  Diagnosis Date   Cancer [facility])    colon cancer   Diabetes mellitus without complication ([place])    Hypercholesteremia    Hypertension    Ruptured lumbar disc     Past Surgical History:  Procedure Laterality Date   COLON SURGERY     COLONOSCOPY     COLONOSCOPY WITH PROPOFOL N/A 04/21/2019   Procedure: COLONOSCOPY WITH PROPOFOL;  Surgeon: Dalton Bellows, MD;  Location: [facility] ENDOSCOPY;  Service: Gastroenterology;  Laterality: N/A;   EYE SURGERY Bilateral    cataracts bilaterally   LAPAROSCOPIC RIGHT COLECTOMY N/A 05/26/2019   Procedure: LAPAROSCOPIC RIGHT COLECTOMY;  Surgeon: Jules Husbands, MD;  Location: [facility] ORS;  Service: General;  Laterality: N/A;   LUMBAR LAMINECTOMY     UPPER GI ENDOSCOPY      Prior to Admission medications   Medication Sig Start Date End Date Taking? Authorizing Provider  enalapril (VASOTEC) 20 MG tablet Take 20 mg by mouth 2 (two) times daily.  03/23/19  Yes [provider]  metoprolol tartrate (LOPRESSOR) 25 MG tablet Take 25 mg by mouth 2 (two) times daily.  03/23/19  Yes [provider]  acetaminophen (TYLENOL) 500 MG tablet Take 500 mg by mouth every 6 (six) hours as needed.    [provider]  ASPIRIN LOW DOSE 81 MG EC tablet Take 81 mg by mouth daily.  03/23/19   [provider]  atorvastatin (LIPITOR) 40 MG tablet Take 40 mg by mouth at bedtime.  03/23/19   [provider]  cyclobenzaprine (FLEXERIL) 10 MG tablet Take 1 tablet (10 mg total) by mouth 3 (three) times daily as needed for muscle spasms. 06/03/19   Tylene Fantasia, PA-C  gabapentin (NEURONTIN) 300 MG  capsule Take 300 mg by mouth 3 (three) times daily.  03/23/19   [provider]  hydrochlorothiazide (HYDRODIURIL) 25 MG tablet Take 25 mg by mouth daily.  03/23/19   [provider]  metFORMIN (GLUCOPHAGE-XR) 500 MG 24 hr tablet Take 500 mg by mouth daily with breakfast.  03/23/19   [provider]  naproxen (NAPROSYN) 500 MG tablet Take 500 mg by mouth daily.    [provider]  Omega-3 Fatty Acids (FISH OIL) 1000 MG CAPS Take by mouth.    [provider]  potassium chloride (KLOR-CON) 8 MEQ tablet Take 8 mEq by mouth daily.  03/23/19   [provider]  sildenafil (VIAGRA) 100 MG tablet Take by mouth.    [provider]    Allergies as of 04/22/2020   (No Known Allergies)    Family History  Problem Relation Age of Onset   Breast cancer Mother    Prostate cancer Brother     Social History   Socioeconomic History   Marital status: Legally Separated    Spouse name: Not on file   Number of children: Not on file   Years of education: Not on file   Highest education level: Not on file  Occupational History   Not on file  Tobacco Use   Smoking status: Never Smoker   Smokeless tobacco: Never Used  Vaping Use  Vaping Use: Never used  Substance and Sexual Activity   Alcohol use: Yes    Comment: less than one beer a week   Drug use: Never   Sexual activity: Not on file  Other Topics Concern   Not on file  Social History Narrative   Not on file   Social Determinants of Health   Financial Resource Strain:    Difficulty of Paying Living Expenses: Not on file  Food Insecurity:    Worried About [place] in the Last Year: Not on file   Ran Out of Food in the Last Year: Not on file  Transportation Needs:    Lack of Transportation (Medical): Not on file   Lack of Transportation (Non-Medical): Not on file  Physical Activity:    Days of Exercise per Week: Not on file   Minutes of  Exercise per Session: Not on file  Stress:    Feeling of Stress : Not on file  Social Connections:    Frequency of Communication with Friends and Family: Not on file   Frequency of Social Gatherings with Friends and Family: Not on file   Attends Religious Services: Not on file   Active Member of Clubs or Organizations: Not on file   Attends Archivist Meetings: Not on file   Marital Status: Not on file  Intimate Partner Violence:    Fear of Current or Ex-Partner: Not on file   Emotionally Abused: Not on file   Physically Abused: Not on file   Sexually Abused: Not on file    Review of Systems: See HPI, otherwise negative ROS  Physical Exam: BP (!) 134/101    Pulse 96    Temp 98 F (36.7 C) (Tympanic)    Resp 18    Ht 5\' 4"  (1.626 m)    Wt 76.2 kg    SpO2 100%    BMI 28.84 kg/m  General:   Alert,  pleasant and cooperative in NAD Head:  Normocephalic and atraumatic. Neck:  Supple; no masses or thyromegaly. Lungs:  Clear throughout to auscultation, normal respiratory effort.    Heart:  +S1, +S2, Regular rate and rhythm, No edema. Abdomen:  Soft, nontender and nondistended. Normal bowel sounds, without guarding, and without rebound.   Neurologic:  Alert and  oriented x4;  grossly normal neurologically.  Impression/Plan: [name] is here for an colonoscopy to be performed for colon cancer surveillance, s/p rt hemicolectomy .    Risks, benefits, limitations, and alternatives regarding  colonoscopy have been reviewed with the patient.  Questions have been answered.  All parties agreeable.   Dalton Bellows, MD  05/06/2020, 10:20 AM

## 2020-05-07 NOTE — Anesthesia Postprocedure Evaluation (Signed)
Anesthesia Post Note  Patient: Dalton Villarreal  Procedure(s) Performed: COLONOSCOPY WITH PROPOFOL (N/A )  Patient location during evaluation: Endoscopy Anesthesia Type: General Level of consciousness: awake and alert Pain management: pain level controlled Vital Signs Assessment: post-procedure vital signs reviewed and stable Respiratory status: spontaneous breathing, nonlabored ventilation, respiratory function stable and patient connected to nasal cannula oxygen Cardiovascular status: blood pressure returned to baseline and stable Postop Assessment: no apparent nausea or vomiting Anesthetic complications: no   No complications documented.   Last Vitals:  Vitals:   05/06/20 1106 05/06/20 1116  BP: (!) 154/87 (!) 153/97  Pulse: 73 69  Resp: 18 11  Temp:    SpO2: 100% 99%    Last Pain:  Vitals:   05/06/20 1116  TempSrc:   PainSc: 0-No pain                 Martha Clan

## 2020-05-09 ENCOUNTER — Telehealth: Payer: Self-pay | Admitting: *Deleted

## 2020-05-30 ENCOUNTER — Ambulatory Visit
Payer: Medicaid Other | Attending: Student in an Organized Health Care Education/Training Program | Admitting: Student in an Organized Health Care Education/Training Program

## 2020-06-06 ENCOUNTER — Ambulatory Visit: Payer: Medicaid Other | Admitting: Student in an Organized Health Care Education/Training Program

## 2020-08-30 ENCOUNTER — Inpatient Hospital Stay: Payer: Medicaid Other

## 2020-08-30 ENCOUNTER — Inpatient Hospital Stay: Payer: Medicaid Other | Admitting: Oncology

## 2020-09-12 ENCOUNTER — Inpatient Hospital Stay: Payer: Medicaid Other

## 2020-09-12 ENCOUNTER — Inpatient Hospital Stay: Payer: Medicaid Other | Attending: Oncology | Admitting: Oncology

## 2020-09-12 ENCOUNTER — Other Ambulatory Visit: Payer: Self-pay

## 2020-09-12 ENCOUNTER — Encounter: Payer: Self-pay | Admitting: Oncology

## 2020-09-12 VITALS — BP 120/78 | HR 68 | Temp 97.2°F | Resp 18 | Wt 176.1 lb

## 2020-09-12 DIAGNOSIS — D649 Anemia, unspecified: Secondary | ICD-10-CM | POA: Insufficient documentation

## 2020-09-12 DIAGNOSIS — C182 Malignant neoplasm of ascending colon: Secondary | ICD-10-CM | POA: Diagnosis present

## 2020-09-12 DIAGNOSIS — Z8042 Family history of malignant neoplasm of prostate: Secondary | ICD-10-CM | POA: Diagnosis not present

## 2020-09-12 DIAGNOSIS — Z809 Family history of malignant neoplasm, unspecified: Secondary | ICD-10-CM

## 2020-09-12 DIAGNOSIS — I2510 Atherosclerotic heart disease of native coronary artery without angina pectoris: Secondary | ICD-10-CM | POA: Diagnosis not present

## 2020-09-12 DIAGNOSIS — Z79899 Other long term (current) drug therapy: Secondary | ICD-10-CM | POA: Diagnosis not present

## 2020-09-12 DIAGNOSIS — Z803 Family history of malignant neoplasm of breast: Secondary | ICD-10-CM | POA: Insufficient documentation

## 2020-09-12 DIAGNOSIS — Z9049 Acquired absence of other specified parts of digestive tract: Secondary | ICD-10-CM | POA: Insufficient documentation

## 2020-09-12 LAB — COMPREHENSIVE METABOLIC PANEL
ALT: 26 U/L (ref 0–44)
AST: 28 U/L (ref 15–41)
Albumin: 3.8 g/dL (ref 3.5–5.0)
Alkaline Phosphatase: 56 U/L (ref 38–126)
Anion gap: 4 — ABNORMAL LOW (ref 5–15)
BUN: 11 mg/dL (ref 8–23)
CO2: 27 mmol/L (ref 22–32)
Calcium: 8.6 mg/dL — ABNORMAL LOW (ref 8.9–10.3)
Chloride: 102 mmol/L (ref 98–111)
Creatinine, Ser: 1.04 mg/dL (ref 0.61–1.24)
GFR, Estimated: >60 mL/min (ref >60)
Glucose, Bld: 154 mg/dL — ABNORMAL HIGH (ref 70–99)
Potassium: 3.8 mmol/L (ref 3.5–5.1)
Sodium: 133 mmol/L — ABNORMAL LOW (ref 135–145)
Total Bilirubin: 0.7 mg/dL (ref 0.3–1.2)
Total Protein: 7.1 g/dL (ref 6.5–8.1)

## 2020-09-12 LAB — IRON AND TIBC
Iron: 58 ug/dL (ref 45–182)
Saturation Ratios: 20 % (ref 17.9–39.5)
TIBC: 294 ug/dL (ref 250–450)
UIBC: 236 ug/dL

## 2020-09-12 LAB — CBC WITH DIFFERENTIAL/PLATELET
Abs Immature Granulocytes: 0.01 10*3/uL (ref 0.00–0.07)
Basophils Absolute: 0 10*3/uL (ref 0.0–0.1)
Basophils Relative: 1 %
Eosinophils Absolute: 0.1 10*3/uL (ref 0.0–0.5)
Eosinophils Relative: 4 %
HCT: 36.5 % — ABNORMAL LOW (ref 39.0–52.0)
Hemoglobin: 12.5 g/dL — ABNORMAL LOW (ref 13.0–17.0)
Immature Granulocytes: 0 %
Lymphocytes Relative: 42 %
Lymphs Abs: 1.6 10*3/uL (ref 0.7–4.0)
MCH: 32 pg (ref 26.0–34.0)
MCHC: 34.2 g/dL (ref 30.0–36.0)
MCV: 93.4 fL (ref 80.0–100.0)
Monocytes Absolute: 0.4 10*3/uL (ref 0.1–1.0)
Monocytes Relative: 11 %
Neutro Abs: 1.6 10*3/uL — ABNORMAL LOW (ref 1.7–7.7)
Neutrophils Relative %: 42 %
Platelets: 151 10*3/uL (ref 150–400)
RBC: 3.91 MIL/uL — ABNORMAL LOW (ref 4.22–5.81)
RDW: 13.7 % (ref 11.5–15.5)
WBC: 3.7 10*3/uL — ABNORMAL LOW (ref 4.0–10.5)
nRBC: 0 % (ref 0.0–0.2)

## 2020-09-12 LAB — FERRITIN: Ferritin: 74 ng/mL (ref 24–336)

## 2020-09-12 NOTE — Progress Notes (Signed)
Hematology/Oncology  Follow up note [facility] Telephone:(336) [phone] Fax:(336) [phone]   Patient Care Team: Donnie Coffin, MD as PCP - General (Family Medicine) Clent Jacks, RN as Oncology Nurse Navigator  REFERRING PROVIDER: Donnie Coffin, MD  CHIEF COMPLAINTS/REASON FOR VISIT:  Follow up for colon cancer  HISTORY OF PRESENTING ILLNESS:   Dalton Villarreal is a  75 y.o.  male with PMH listed below was seen in consultation at the request of  Donnie Coffin, MD  for evaluation of colon cancer Patient recently had a routine screening colonoscopy on 04/21/2019. 04/21/2007 colonoscopy showed a fungating nonobstructive large mass was found in the proximal ascending colon.  The mass was not circumferential.  Mass was biopsied. 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 Patient reports he is feeling well at baseline.  Denies any fever, chills, unintentional weight loss, change of bowel habit, blood in the stool, or abdominal pain.  Family history positive for brother with prostate cancer, mother passed away from breast cancer.  #Family history of cancer, recommend genetic testing.  Patient wants to defer.  #Radiographic evidence of CAD on CT scanning.  Discussed with patient.  Lifestyle modification discussed.  Recommend patient to continue follow-up with primary care physician for further management.  # 05/26/2019 patient underwent right hemicolectomy. Pathology positive for invasive adenocarcinoma.  21 lymph node harvested.  Lymphovascular invasion present.  Perineural invasion not identified.  Tumor deposits not identified.  All margins are negative.  pT1 pN0   INTERVAL HISTORY Dalton Villarreal is a 75 y.o. male who has above history reviewed by me today presents for follow up visit for management of colon cancer Problems and complaints are listed below: Patient reports no new complaints. Patient reports feeling well .  He  denies any bloody or black stool. Denies any abdominal pain, appetite is good. Patient has gained 7 pounds since last visit. 1 year post surgery colonoscopy -05/06/2020, patient has had colonoscopy done by Dr. Vicente Males.  Healthy-appearing mucosa at the end to end ileocolonic anastomosis.  Examination was otherwise normal.  No specimens were collected.  Review of Systems  Constitutional: Negative for appetite change, chills, fatigue, fever and unexpected weight change.  HENT:   Negative for hearing loss and voice change.   Eyes: Negative for eye problems and icterus.  Respiratory: Negative for chest tightness, cough and shortness of breath.   Cardiovascular: Negative for chest pain and leg swelling.  Gastrointestinal: Negative for abdominal distention and abdominal pain.  Endocrine: Negative for hot flashes.  Genitourinary: Negative for difficulty urinating, dysuria and frequency.   Musculoskeletal: Negative for arthralgias.  Skin: Negative for itching and rash.  Neurological: Negative for light-headedness and numbness.  Hematological: Negative for adenopathy. Does not bruise/bleed easily.  Psychiatric/Behavioral: Negative for confusion.    MEDICAL HISTORY:  Past Medical History:  Diagnosis Date  . Cancer [facility])    colon cancer  . Diabetes mellitus without complication ([place])   . Hypercholesteremia   . Hypertension   . Ruptured lumbar disc     SURGICAL HISTORY: Past Surgical History:  Procedure Laterality Date  . COLON SURGERY    . COLONOSCOPY    . COLONOSCOPY WITH PROPOFOL N/A 04/21/2019   Procedure: COLONOSCOPY WITH PROPOFOL;  Surgeon: [name], MD;  Location: [facility] ENDOSCOPY;  Service: Gastroenterology;  Laterality: N/A;  . COLONOSCOPY WITH PROPOFOL N/A 05/06/2020   Procedure: COLONOSCOPY WITH PROPOFOL;  Surgeon: [name], MD;  Location: [facility] ENDOSCOPY;  Service: Gastroenterology;  Laterality: N/A;  .  EYE SURGERY Bilateral    cataracts bilaterally  . LAPAROSCOPIC RIGHT  COLECTOMY N/A 05/26/2019   Procedure: LAPAROSCOPIC RIGHT COLECTOMY;  Surgeon: Jules Husbands, MD;  Location: [facility] ORS;  Service: General;  Laterality: N/A;  . LUMBAR LAMINECTOMY    . UPPER GI ENDOSCOPY      SOCIAL HISTORY: Social History   Socioeconomic History  . Marital status: Legally Separated    Spouse name: Not on file  . Number of children: Not on file  . Years of education: Not on file  . Highest education level: Not on file  Occupational History  . Not on file  Tobacco Use  . Smoking status: Never Smoker  . Smokeless tobacco: Never Used  Vaping Use  . Vaping Use: Never used  Substance and Sexual Activity  . Alcohol use: Yes    Comment: less than one beer a week  . Drug use: Never  . Sexual activity: Not on file  Other Topics Concern  . Not on file  Social History Narrative  . Not on file   Social Determinants of Health   Financial Resource Strain: Not on file  Food Insecurity: Not on file  Transportation Needs: Not on file  Physical Activity: Not on file  Stress: Not on file  Social Connections: Not on file  Intimate Partner Violence: Not on file    FAMILY HISTORY: Family History  Problem Relation Age of Onset  . Breast cancer Mother   . Prostate cancer Brother     ALLERGIES:  has No Known Allergies.  MEDICATIONS:  Current Outpatient Medications  Medication Sig Dispense Refill  . acetaminophen (TYLENOL) 500 MG tablet Take 500 mg by mouth every 6 (six) hours as needed.    . ASPIRIN LOW DOSE 81 MG EC tablet Take 81 mg by mouth daily.     Marland Kitchen atorvastatin (LIPITOR) 40 MG tablet Take 40 mg by mouth at bedtime.     . cyclobenzaprine (FLEXERIL) 10 MG tablet Take 1 tablet (10 mg total) by mouth 3 (three) times daily as needed for muscle spasms. 30 tablet 0  . enalapril (VASOTEC) 20 MG tablet Take 20 mg by mouth 2 (two) times daily.     Marland Kitchen gabapentin (NEURONTIN) 300 MG capsule Take 300 mg by mouth 3 (three) times daily.     . hydrochlorothiazide  (HYDRODIURIL) 25 MG tablet Take 25 mg by mouth daily.     . metFORMIN (GLUCOPHAGE-XR) 500 MG 24 hr tablet Take 500 mg by mouth daily with breakfast.     . metoprolol tartrate (LOPRESSOR) 25 MG tablet Take 25 mg by mouth 2 (two) times daily.     . naproxen (NAPROSYN) 500 MG tablet Take 500 mg by mouth daily.    . Omega-3 Fatty Acids (FISH OIL) 1000 MG CAPS Take by mouth.    . potassium chloride (KLOR-CON) 8 MEQ tablet Take 8 mEq by mouth daily.     . sildenafil (VIAGRA) 100 MG tablet Take by mouth.     No current facility-administered medications for this visit.     PHYSICAL EXAMINATION: ECOG PERFORMANCE STATUS: 1 - Symptomatic but completely ambulatory Vitals:   09/12/20 1435  BP: 120/78  Pulse: 68  Resp: 18  Temp: (!) 97.2 F (36.2 C)   Filed Weights   09/12/20 1435  Weight: 176 lb 1.6 oz (79.9 kg)    Physical Exam Constitutional:      General: He is not in acute distress. HENT:     Head: Normocephalic and  atraumatic.  Eyes:     General: No scleral icterus.    Pupils: Pupils are equal, round, and reactive to light.  Cardiovascular:     Rate and Rhythm: Normal rate and regular rhythm.     Heart sounds: Normal heart sounds.  Pulmonary:     Effort: Pulmonary effort is normal. No respiratory distress.     Breath sounds: No wheezing.  Abdominal:     General: Bowel sounds are normal. There is no distension.     Palpations: Abdomen is soft. There is no mass.     Tenderness: There is no abdominal tenderness.  Musculoskeletal:        General: No deformity. Normal range of motion.     Cervical back: Normal range of motion and neck supple.  Skin:    General: Skin is warm and dry.     Findings: No erythema or rash.  Neurological:     Mental Status: He is alert and oriented to person, place, and time.     Cranial Nerves: No cranial nerve deficit.     Coordination: Coordination normal.  Psychiatric:        Behavior: Behavior normal.        Thought Content: Thought content  normal.     LABORATORY DATA:  I have reviewed the data as listed Lab Results  Component Value Date   WBC 3.7 (L) 09/12/2020   HGB 12.5 (L) 09/12/2020   HCT 36.5 (L) 09/12/2020   MCV 93.4 09/12/2020   PLT 151 09/12/2020   Recent Labs    03/04/20 1233 09/12/20 1328  NA 138 133*  K 3.2* 3.8  CL 102 102  CO2 28 27  GLUCOSE 92 154*  BUN 16 11  CREATININE 1.03 1.04  CALCIUM 9.1 8.6*  GFRNONAA >60 >60  GFRAA >60  --   PROT 7.6 7.1  ALBUMIN 4.2 3.8  AST 37 28  ALT 27 26  ALKPHOS 57 56  BILITOT 0.9 0.7   Iron/TIBC/Ferritin/ %Sat    Component Value Date/Time   IRON 58 09/12/2020 1328   TIBC 294 09/12/2020 1328   FERRITIN 74 09/12/2020 1328   IRONPCTSAT 20 09/12/2020 1328     Baseline CEA has been checked on 04/23/2019 which was 3.7. RADIOGRAPHIC STUDIES: I have personally reviewed the radiological images as listed and agreed with the findings in the report. No results found.     ASSESSMENT & PLAN:  1. Malignant neoplasm of ascending colon ([place])   2. Family history of cancer   3. Normocytic anemia   Cancer Staging Malignant neoplasm of colon ([place]) Staging form: Colon and Rectum, AJCC 8th Edition - Clinical: No stage assigned - Unsigned - Pathologic: Stage I (pT1, pN0, cM0) - Signed by Earlie Server, MD on 06/13/2019  #Stage I right colon cancer status post right hemicolectomy. Labs reviewed and discussed with patient. Discussed about NCCN guideline for surveillance of stage I colon cancer. Patient has had a repeat colonoscopy 1 year his surgery.  Recommend patient follow-up in 6 months.  #Normocytic anemia, his hemoglobin is stable at 12.5.  I obtain iron panel which appears stable and not consistent with iron deficiency.  Continue to monitor. #Family history as well as personal history of colon cancer.  Previously referred him to genetic counselor and he declined.Today we discussed this topic again and he agrees.  Will refer to genetic counselor.   Orders Placed  This Encounter  Procedures  . Iron and TIBC    Standing Status:  Future    Number of Occurrences:   1    Standing Expiration Date:   09/12/2021  . Ferritin    Standing Status:   Future    Number of Occurrences:   1    Standing Expiration Date:   09/12/2021  . Comprehensive metabolic panel    Standing Status:   Future    Standing Expiration Date:   09/12/2021  . CBC with Differential/Platelet    Standing Status:   Future    Standing Expiration Date:   09/12/2021  . CEA    Standing Status:   Future    Standing Expiration Date:   09/12/2021  . Ambulatory referral to Genetics    Referral Priority:   Routine    Referral Type:   Consultation    Referral Reason:   Specialty Services Required    Number of Visits Requested:   1    All questions were answered. The patient knows to call the clinic with any problems questions or concerns.  Return of visit: 6 months  Earlie Server, MD, PhD Hematology Oncology [facility] Pager- [phone] 09/12/2020

## 2020-09-12 NOTE — Progress Notes (Signed)
Pt here for follow up. No new concerns voiced.   

## 2020-09-13 LAB — CEA: CEA: 5.2 ng/mL — ABNORMAL HIGH (ref 0.0–4.7)

## 2020-09-14 ENCOUNTER — Telehealth: Payer: Self-pay

## 2020-09-14 DIAGNOSIS — C182 Malignant neoplasm of ascending colon: Secondary | ICD-10-CM

## 2020-09-14 NOTE — Telephone Encounter (Signed)
Patient is scheduled for CT in [place] on 2/9 due to the first available in [place] is 2/15.  Will confirm with patient that he is able to go to [facility] for CT.  Tried calling paitent but no answer and voicemail box is full.

## 2020-09-14 NOTE — Telephone Encounter (Signed)
Please schedule CT as MD recommends and I will inform him of appt details when I call lab results.

## 2020-09-14 NOTE — Telephone Encounter (Signed)
-----   Message from Earlie Server, MD sent at 09/13/2020 11:14 PM EST ----- Please let him know that his tumor marker is increased. Recommend him to check CT chest abdomen pelvis with contrast, (IV and oral contrast). Thank you.

## 2020-09-14 NOTE — Telephone Encounter (Signed)
Spoke to patient, he can't go go [place] for the CT and needs it scheduled in [place].  Please schedule in [place] and notify him of appt details.

## 2020-09-15 NOTE — Telephone Encounter (Signed)
CT changed to [facility] from [facility] as requested.  Spoke to patient and he is aware.  I told him I will also print the reminder with instructions and put in the mail for him.

## 2020-09-21 ENCOUNTER — Ambulatory Visit: Payer: Medicaid Other

## 2020-10-04 ENCOUNTER — Other Ambulatory Visit: Payer: Self-pay

## 2020-10-04 ENCOUNTER — Ambulatory Visit
Admission: RE | Admit: 2020-10-04 | Discharge: 2020-10-04 | Disposition: A | Discharge status: Home / Self Care | Payer: Medicaid Other | Source: Ambulatory Visit | Attending: Oncology | Admitting: Oncology

## 2020-10-04 DIAGNOSIS — C182 Malignant neoplasm of ascending colon: Secondary | ICD-10-CM | POA: Insufficient documentation

## 2020-10-04 MED ORDER — IOHEXOL 300 MG/ML  SOLN
100.0000 mL | Freq: Once | INTRAMUSCULAR | Status: AC | PRN
  Administered 2020-10-04: 100 mL via INTRAVENOUS

## 2020-10-04 MED ORDER — IOHEXOL 300 MG/ML  SOLN: 100.0000 mL | Freq: Once | INTRAMUSCULAR | Status: DC | PRN

## 2020-10-05 ENCOUNTER — Telehealth: Payer: Self-pay | Admitting: *Deleted

## 2020-10-05 ENCOUNTER — Other Ambulatory Visit: Payer: Self-pay

## 2020-10-05 DIAGNOSIS — R339 Retention of urine, unspecified: Secondary | ICD-10-CM

## 2020-10-05 NOTE — Telephone Encounter (Signed)
Called report  IMPRESSION: 1. Status post right hemicolectomy and reanastomosis. No evidence of mass, lymphadenopathy, or metastatic disease in the chest, abdomen, or pelvis. 2. Markedly distended urinary bladder, measuring greater than 20 cm. Correlate for urinary retention. 3. Prostatomegaly with median lobe hypertrophy, likely contributing to outlet obstruction. 4. Hepatic steatosis.  These results will be called to the ordering clinician or representative by the Radiologist Assistant, and communication documented in the PACS or [employer].  Aortic Atherosclerosis (ICD10-I70.0).   Electronically Signed   By: Eddie Candle M.D.   On: 10/05/2020 12:17

## 2020-10-05 NOTE — Telephone Encounter (Signed)
Per Dr Tasia Catchings please let him know that CT showed no cancer. but he seems to have urinary retention and please ask if he has trouble passing urine. recommend him to go to ER if so. refer to urology urgently  I called and spoke with patient who denies any problems passing his urine in fact states that he goes a lot so he does not need to go to ER. He is agreeable to Urology referral. Dr Collie Siad nurse will enter the Urology referral

## 2020-10-10 ENCOUNTER — Other Ambulatory Visit: Payer: Self-pay

## 2020-10-10 ENCOUNTER — Ambulatory Visit (INDEPENDENT_AMBULATORY_CARE_PROVIDER_SITE_OTHER): Payer: Medicaid Other | Admitting: Urology

## 2020-10-10 ENCOUNTER — Encounter: Payer: Self-pay | Admitting: Urology

## 2020-10-10 VITALS — BP 180/113 | HR 99 | Ht 64.0 in | Wt 175.5 lb

## 2020-10-10 DIAGNOSIS — R339 Retention of urine, unspecified: Secondary | ICD-10-CM | POA: Diagnosis not present

## 2020-10-10 DIAGNOSIS — N401 Enlarged prostate with lower urinary tract symptoms: Secondary | ICD-10-CM | POA: Diagnosis not present

## 2020-10-10 DIAGNOSIS — N138 Other obstructive and reflux uropathy: Secondary | ICD-10-CM

## 2020-10-10 DIAGNOSIS — Z125 Encounter for screening for malignant neoplasm of prostate: Secondary | ICD-10-CM

## 2020-10-10 LAB — URINALYSIS, COMPLETE
Bilirubin, UA: NEGATIVE
Glucose, UA: NEGATIVE
Ketones, UA: NEGATIVE
Leukocytes,UA: NEGATIVE
Nitrite, UA: NEGATIVE
Protein,UA: NEGATIVE
RBC, UA: NEGATIVE
Specific Gravity, UA: 1.015 (ref 1.005–1.030)
Urobilinogen, Ur: 2 mg/dL — ABNORMAL HIGH (ref 0.2–1.0)
pH, UA: 8.5 — ABNORMAL HIGH (ref 5.0–7.5)

## 2020-10-10 LAB — MICROSCOPIC EXAMINATION: Bacteria, UA: NONE SEEN

## 2020-10-10 LAB — BLADDER SCAN AMB NON-IMAGING

## 2020-10-10 NOTE — Progress Notes (Signed)
10/10/20 11:26 AM   Dalton Villarreal Jul 02, 1946 [id number]  CC: Incomplete bladder emptying, BPH  HPI: I saw Dalton Villarreal in urology clinic for the above issues.  He reports 2 to 3 months of worsening urinary symptoms with urgency, frequency, and overflow incontinence.  He was sent over by his oncologist after recent CT for follow-up of colon cancer showed a massively distended bladder.  There is no evidence of hydronephrosis, and renal function is normal with recent creatinine of 1.04, EGFR greater than 60.  These are not new findings based on his CT of September 2020 that showed a similarly distended bladder.  He denies any history of urinary infections, retention, or gross hematuria.  He has a family history of lethal prostate cancer in his brother, and a grandfather.    IPSS score today 20, and PVR significantly elevated at 450 mL.  Urinalysis shows 6-10 WBCs, 0-2 RBCs, no bacteria, no yeast, nitrite negative, no leukocytes.  No prior PSA values to review.   PMH: Past Medical History:  Diagnosis Date  . Cancer [facility])    colon cancer  . Diabetes mellitus without complication ([place])   . Hypercholesteremia   . Hypertension   . Ruptured lumbar disc     Surgical History: Past Surgical History:  Procedure Laterality Date  . COLON SURGERY    . COLONOSCOPY    . COLONOSCOPY WITH PROPOFOL N/A 04/21/2019   Procedure: COLONOSCOPY WITH PROPOFOL;  Surgeon: [name], MD;  Location: [facility] ENDOSCOPY;  Service: Gastroenterology;  Laterality: N/A;  . COLONOSCOPY WITH PROPOFOL N/A 05/06/2020   Procedure: COLONOSCOPY WITH PROPOFOL;  Surgeon: [name], MD;  Location: [facility] ENDOSCOPY;  Service: Gastroenterology;  Laterality: N/A;  . EYE SURGERY Bilateral    cataracts bilaterally  . LAPAROSCOPIC RIGHT COLECTOMY N/A 05/26/2019   Procedure: LAPAROSCOPIC RIGHT COLECTOMY;  Surgeon: Jules Husbands, MD;  Location: [facility] ORS;  Service: General;  Laterality: N/A;  . LUMBAR LAMINECTOMY    . UPPER GI  ENDOSCOPY      Family History: Family History  Problem Relation Age of Onset  . Breast cancer Mother   . Prostate cancer Brother     Social History:  reports that he has never smoked. He has never used smokeless tobacco. He reports current alcohol use. He reports that he does not use drugs.  Physical Exam: BP (!) 180/113 (BP Location: Left Arm, Patient Position: Sitting, Cuff Size: Large) Comment: Pt has not taken BP meds in several days  Pulse 99   Ht 5' 4"  (1.626 m)   Wt 175 lb 8 oz (79.6 kg)   BMI 30.12 kg/m    Constitutional:  Alert and oriented, No acute distress. Cardiovascular: No clubbing, cyanosis, or edema. Respiratory: Normal respiratory effort, no increased work of breathing. GI: Abdomen is soft, nontender, nondistended, no abdominal masses GU: No CVA tenderness, phallus without lesions, widely patent meatus DRE: 70 g, smooth, no nodules or masses  Laboratory Data: Reviewed, see HPI  Pertinent Imaging: I have personally viewed and interpreted the CT dated 10/05/2020 showing a massively distended bladder with 70 g prostate, no hydronephrosis or stones.  Bladder distention is relatively stable from CT of September 2020.  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  PVR significantly elevated today, and he is having urgency, frequency, and what sounds like overflow incontinence.  DRE is benign.  I recommended  a PSA today with a strong family history of lethal prostate cancer, and close follow-up for cystoscopy to evaluate for urethral stricture versus obstructive prostate tissue.  We reviewed that he may be a good candidate for an outlet procedure in the future, based on his incomplete emptying and large prostate HOLEP likely would be the best option, but cystoscopy will better direct these options.  Surveillance would also be  an option, but he would be at high risk for developing retention or worsening renal function.  We discussed the risks and benefits of HoLEP at length.  The procedure requires general anesthesia and takes 2 to 3 hours, and a holmium laser is used to enucleate the prostate and push this tissue into the bladder.  A morcellator is then used to remove this tissue, which is sent for pathology.  The vast majority of patients are able to discharge the same day with a catheter in place for 2 to 3 days, and will follow-up in clinic for a voiding trial.  Approximately 5% of patients will be admitted overnight to monitor the urine, or if they have multiple co-morbidities.  We specifically discussed the risks of bleeding, infection, retrograde ejaculation, temporary urgency and urge incontinence, very low risk of long-term incontinence, pathologic evaluation of prostate tissue and possible detection of prostate cancer or other malignancy, and possible need for additional procedures.  PSA today, call with results Follow-up for cystoscopy and discuss outlet procedures  [name], MD 10/10/2020  [place] [address] [place], [place] [address] [phone]

## 2020-10-10 NOTE — Patient Instructions (Addendum)
Holmium Laser Enucleation of the Prostate (HoLEP)  HoLEP is a treatment for men with benign prostatic hyperplasia (BPH). The laser surgery removed blockages of urine flow, and is done without any incisions on the body.     What is HoLEP?  HoLEP is a type of laser surgery used to treat obstruction (blockage) of urine flow as a result of benign prostatic hyperplasia (BPH). In men with BPH, the prostate gland is not cancerous, but has become enlarged. An enlarged prostate can result in a number of urinary tract symptoms such as weak urinary stream, difficulty in starting urination, inability to urinate, frequent urination, or getting up at night to urinate.  HoLEP was developed in the 1990's as a more effective and less expensive surgical option for BPH, compared to other surgical options such as laser vaporization(PVP/greenlight laser), transurethral resection of the prostate(TURP), and open simple prostatectomy.   What happens during a HoLEP?  HoLEP requires general anesthesia ("asleep" throughout the procedure).   An antibiotic is given to reduce the risk of infection  A surgical instrument called a resectoscope is inserted through the urethra (the tube that carries urine from the bladder). The resectoscope has a camera that allows the surgeon to view the internal structure of the prostate gland, and to see where the incisions are being made during surgery.  The laser is inserted into the resectoscope and is used to enucleate (free up) the enlarged prostate tissue from the capsule (outer shell) and then to seal up any blood vessels. The tissue that has been removed is pushed back into the bladder.  A morcellator is placed through the resectoscope, and is used to suction out the prostate tissue that has been pushed into the bladder.  When the prostate tissue has been removed, the resectoscope is removed, and a foley catheter is placed to allow healing and drain the urine from the  bladder.     What happens after a HoLEP?  More than 90% of patients go home the same day a few hours after surgery. Less than 10% will be admitted to the hospital overnight for observation to monitor the urine, or if they have other medical problems.  Fluid is flushed through the catheter for about 1 hour after surgery to clear any blood from the urine. It is normal to have some blood in the urine after surgery. The need for blood transfusion is extremely rare.  Eating and drinking are permitted after the procedure once the patient has fully awakened from anesthesia.  The catheter is usually removed 2-3 days after surgery- the patient will come to clinic to have the catheter removed and make sure they can urinate on their own.  It is very important to drink lots of fluids after surgery for one week to keep the bladder flushed.  At first, there may be some burning with urination, but this typically improved within a few hours to days. Most patients do not have a significant amount of pain, and narcotic pain medications are rarely needed.  Symptoms of urinary frequency, urgency, and even leakage are NORMAL for the first few weeks after surgery as the bladder adjusts after having to work hard against blockage from the prostate for many years. This will improve, but can sometimes take several months.  The use of pelvic floor exercises (Kegel exercises) can help improve problems with urinary incontinence.   After catheter removal, patients will be seen at 6 weeks and 6 months for symptom check  No heavy lifting for   at least 2-3 weeks after surgery, however patients can walk and do light activities the first day after surgery. Return to work time depends on occupation.    What are the advantages of HoLEP?  HoLEP has been studied in many different parts of the world and has been shown to be a safe and effective procedure. Although there are many types of BPH surgeries available, HoLEP offers a  unique advantage in being able to remove a large amount of tissue without any incisions on the body, even in very large prostates, while decreasing the risk of bleeding and providing tissue for pathology (to look for cancer). This decreases the need for blood transfusions during surgery, minimizes hospital stay, and reduces the risk of needing repeat treatment.  What are the side effects of HoLEP?  Temporary burning and bleeding during urination. Some blood may be seen in the urine for weeks after surgery and is part of the healing process.  Urinary incontinence (inability to control urine flow) is expected in all patients immediately after surgery and they should wear pads for the first few days/weeks. This typically improves over the course of several weeks. Performing Kegel exercises can help decrease leakage from stress maneuvers such as coughing, sneezing, or lifting. The rate of long term leakage is very low. Patients may also have leakage with urgency and this may be treated with medication. The risk of urge incontinence can be dependent on several factors including age, prostate size, symptoms, and other medical problems.  Retrograde ejaculation or "backwards ejaculation." In 75% of cases, the patient will not see any fluid during ejaculation after surgery.  Erectile function is generally not significantly affected.   What are the risks of HoLEP?  Injury to the urethra or development of scar tissue at a later date  Injury to the capsule of the prostate (typically treated with longer catheterization).  Injury to the bladder or ureteral orifices (where the urine from the kidney drains out)  Infection of the bladder, testes, or kidneys  Return of urinary obstruction at a later date requiring another operation (<2%)  Need for blood transfusion or re-operation due to bleeding  Failure to relieve all symptoms and/or need for prolonged catheterization after surgery  5-15% of patients are  found to have previously undiagnosed prostate cancer in their specimen. Prostate cancer can be treated after HoLEP.  Standard risks of anesthesia including blood clots, heart attacks, etc  When should I call my doctor?  Fever over 101.3 degrees  Inability to urinate, or large blood clots in the urine   Cystoscopy Cystoscopy is a procedure that is used to help diagnose and sometimes treat conditions that affect the lower urinary tract. The lower urinary tract includes the bladder and the urethra. The urethra is the tube that drains urine from the bladder. Cystoscopy is done using a thin, tube-shaped instrument with a light and camera at the end (cystoscope). The cystoscope may be hard or flexible, depending on the goal of the procedure. The cystoscope is inserted through the urethra, into the bladder. Cystoscopy may be recommended if you have:  Urinary tract infections that keep coming back.  Blood in the urine (hematuria).  An inability to control when you urinate (urinary incontinence) or an overactive bladder.  Unusual cells found in a urine sample.  A blockage in the urethra, such as a urinary stone.  Painful urination.  An abnormality in the bladder found during an intravenous pyelogram (IVP) or CT scan. Cystoscopy may also be done  to remove a sample of tissue to be examined under a microscope (biopsy). What are the risks? Generally, this is a safe procedure. However, problems may occur, including:  Infection.  Bleeding.  What happens during the procedure?  1. You will be given one or more of the following: ? A medicine to numb the area (local anesthetic). 2. The area around the opening of your urethra will be cleaned. 3. The cystoscope will be passed through your urethra into your bladder. 4. Germ-free (sterile) fluid will flow through the cystoscope to fill your bladder. The fluid will stretch your bladder so that your health care provider can clearly examine your  bladder walls. 5. Your doctor will look at the urethra and bladder. 6. The cystoscope will be removed The procedure may vary among health care providers  What can I expect after the procedure? After the procedure, it is common to have: 1. Some soreness or pain in your abdomen and urethra. 2. Urinary symptoms. These include: ? Mild pain or burning when you urinate. Pain should stop within a few minutes after you urinate. This may last for up to 1 week. ? A small amount of blood in your urine for several days. ? Feeling like you need to urinate but producing only a small amount of urine. Follow these instructions at home: General instructions  Return to your normal activities as told by your health care provider.   Do not drive for 24 hours if you were given a sedative during your procedure.  Watch for any blood in your urine. If the amount of blood in your urine increases, call your health care provider.  If a tissue sample was removed for testing (biopsy) during your procedure, it is up to you to get your test results. Ask your health care provider, or the department that is doing the test, when your results will be ready.  Drink enough fluid to keep your urine pale yellow.  Keep all follow-up visits as told by your health care provider. This is important. Contact a health care provider if you:  Have pain that gets worse or does not get better with medicine, especially pain when you urinate.  Have trouble urinating.  Have more blood in your urine. Get help right away if you:  Have blood clots in your urine.  Have abdominal pain.  Have a fever or chills.  Are unable to urinate. Summary  Cystoscopy is a procedure that is used to help diagnose and sometimes treat conditions that affect the lower urinary tract.  Cystoscopy is done using a thin, tube-shaped instrument with a light and camera at the end.  After the procedure, it is common to have some soreness or pain in your  abdomen and urethra.  Watch for any blood in your urine. If the amount of blood in your urine increases, call your health care provider.  If you were prescribed an antibiotic medicine, take it as told by your health care provider. Do not stop taking the antibiotic even if you start to feel better. This information is not intended to replace advice given to you by your health care provider. Make sure you discuss any questions you have with your health care provider. Document Revised: 07/22/2018 Document Reviewed: 07/22/2018 Elsevier Patient Education  [place].

## 2020-10-11 LAB — PSA TOTAL (REFLEX TO FREE): Prostate Specific Ag, Serum: 0.6 ng/mL (ref 0.0–4.0)

## 2020-10-12 ENCOUNTER — Telehealth: Payer: Self-pay

## 2020-10-12 NOTE — Telephone Encounter (Signed)
-----   Message from [name], MD sent at 10/11/2020 12:49 PM EST ----- PSA normal, keep follow up as scheduled for cysto  [name], MD 10/11/2020

## 2020-10-12 NOTE — Telephone Encounter (Signed)
Called pt informed him of the information below. Pt gave verbal understanding. Advised pt keep Cysto appt.

## 2020-10-27 ENCOUNTER — Other Ambulatory Visit: Payer: Self-pay | Admitting: Urology

## 2020-11-02 ENCOUNTER — Ambulatory Visit (INDEPENDENT_AMBULATORY_CARE_PROVIDER_SITE_OTHER): Payer: Medicaid Other | Admitting: Urology

## 2020-11-02 ENCOUNTER — Encounter: Payer: Self-pay | Admitting: Urology

## 2020-11-02 ENCOUNTER — Other Ambulatory Visit: Payer: Self-pay

## 2020-11-02 VITALS — BP 143/88 | HR 73 | Ht 64.0 in | Wt 177.0 lb

## 2020-11-02 DIAGNOSIS — R339 Retention of urine, unspecified: Secondary | ICD-10-CM | POA: Diagnosis not present

## 2020-11-02 DIAGNOSIS — N138 Other obstructive and reflux uropathy: Secondary | ICD-10-CM

## 2020-11-02 DIAGNOSIS — N401 Enlarged prostate with lower urinary tract symptoms: Secondary | ICD-10-CM | POA: Diagnosis not present

## 2020-11-02 NOTE — Progress Notes (Signed)
Cystoscopy Procedure Note:  Indication: BPH, incomplete bladder emptying  HPI: 75 year old male with long history of incomplete bladder emptying and distended bladder on CT without hydronephrosis, who reports 6 months of worsening urinary symptoms with overflow incontinence, urgency, and weak stream.  Prostate measures 70 g on recent CT.  After informed consent and discussion of the procedure and its risks, Dalton Villarreal was positioned and prepped in the standard fashion. Cystoscopy was performed with a flexible cystoscope. The urethra, bladder neck and entire bladder was visualized in a standard fashion. The prostate was large with a high bladder neck and obstructing lateral lobes, small median lobe. The ureteral orifices were visualized in their normal location and orientation.  No bladder abnormalities noted, no abnormalities on retroflexion  Findings: Large obstructive prostate with high bladder neck  --------------------------------------------------------------------  Assessment and Plan: 75 year old male with 70 g prostate on CT and long history of incomplete emptying based on prior CT scan, PVRs ~561mL, now with worsening urinary symptoms of frequency, urge incontinence, and overflow incontinence secondary to incomplete emptying and BPH.  We discussed options including chronic Foley catheter, intermittent catheterization, or an outlet procedure.  With his incomplete emptying and very elevated PVRs as well as large prostate, think he is best suited by HOLEP.  We discussed the risks and benefits of HoLEP at length.  The procedure requires general anesthesia and takes 2 to 3 hours, and a holmium laser is used to enucleate the prostate and push this tissue into the bladder.  A morcellator is then used to remove this tissue, which is sent for pathology.  The vast majority of patients are able to discharge the same day with a catheter in place for 2 to 3 days, and will follow-up in clinic for a  voiding trial.  Approximately 5% of patients will be admitted overnight to monitor the urine, or if they have multiple co-morbidities.  We specifically discussed the risks of bleeding, infection, retrograde ejaculation, temporary urgency and urge incontinence, very low risk of long-term incontinence, pathologic evaluation of prostate tissue and possible detection of prostate cancer or other malignancy, and possible need for additional procedures.  Schedule HOLEP  Dalton Madrid, MD 11/02/2020

## 2020-11-02 NOTE — Patient Instructions (Signed)

## 2020-11-04 ENCOUNTER — Other Ambulatory Visit: Payer: Self-pay | Admitting: Urology

## 2020-11-04 ENCOUNTER — Telehealth: Payer: Self-pay | Admitting: Urology

## 2020-11-04 DIAGNOSIS — N401 Enlarged prostate with lower urinary tract symptoms: Secondary | ICD-10-CM

## 2020-11-04 DIAGNOSIS — N138 Other obstructive and reflux uropathy: Secondary | ICD-10-CM

## 2020-11-04 NOTE — Telephone Encounter (Signed)
I have attempted to call this pt.and his daughter  Multiple times at the contact numbers given however mailbox is full. I need to speak to them regarding surgery scheduling for pt.

## 2020-11-04 NOTE — Telephone Encounter (Signed)
Pt.'s daughter returned call and will have Mr.Skolnick call me.

## 2020-11-07 ENCOUNTER — Other Ambulatory Visit: Payer: Self-pay

## 2020-11-07 ENCOUNTER — Other Ambulatory Visit: Payer: Medicaid Other

## 2020-11-07 DIAGNOSIS — N138 Other obstructive and reflux uropathy: Secondary | ICD-10-CM

## 2020-11-11 ENCOUNTER — Telehealth: Payer: Self-pay

## 2020-11-11 ENCOUNTER — Other Ambulatory Visit
Admission: RE | Admit: 2020-11-11 | Discharge: 2020-11-11 | Disposition: A | Discharge status: Home / Self Care | Payer: Medicaid Other | Source: Ambulatory Visit | Attending: Urology | Admitting: Urology

## 2020-11-11 ENCOUNTER — Other Ambulatory Visit: Payer: Self-pay

## 2020-11-11 DIAGNOSIS — B952 Enterococcus as the cause of diseases classified elsewhere: Secondary | ICD-10-CM

## 2020-11-11 MED ORDER — AMOXICILLIN 500 MG PO CAPS: 500.0000 mg | ORAL_CAPSULE | Freq: Two times a day (BID) | ORAL | 0 refills | Status: DC

## 2020-11-11 NOTE — Telephone Encounter (Signed)
Called pt informed her of the information below. Pt gave verbal understanding. RX sent.  

## 2020-11-11 NOTE — Telephone Encounter (Signed)
-----   Message from [name], MD sent at 11/11/2020 12:24 PM EDT ----- Arloa Koh do amoxicillin 500mg  BID x 7 days, thanks. Has holep next week  [name], MD 11/11/2020

## 2020-11-11 NOTE — Patient Instructions (Signed)
Your procedure is scheduled on: Friday November 18, 2020. Report to Day Surgery inside [facility] 2nd floor (stop by Admissions desk first before getting on elevator). To find out your arrival time please call [phone] between 1PM - 3PM on Thursday November 17, 2020.  Remember: Instructions that are not followed completely may result in serious medical risk,  up to and including death, or upon the discretion of your surgeon and anesthesiologist your  surgery may need to be rescheduled.     _X__ 1. Do not eat food or drink fluids after midnight the night before your procedure.                 No chewing gum or hard candies.   __X__2.  On the morning of surgery brush your teeth with toothpaste and water, you                may rinse your mouth with mouthwash if you wish.  Do not swallow any toothpaste of mouthwash.     _X__ 3.  No Alcohol for 24 hours before or after surgery.   _X__ 4.  Do Not Smoke or use e-cigarettes For 24 Hours Prior to Your Surgery.                 Do not use any chewable tobacco products for at least 6 hours prior to                 Surgery.  _X__  5.  Do not use any recreational drugs (marijuana, cocaine, heroin, ecstasy, MDMA or other)                For at least one week prior to your surgery.  Combination of these drugs with anesthesia                May have life threatening results.  __X__ 6.  Notify your doctor if there is any change in your medical condition      (cold, fever, infections).     Do not wear jewelry, make-up, hairpins, clips or nail polish. Do not wear lotions, powders, or perfumes. You may wear deodorant. Do not shave 48 hours prior to surgery. Men may shave face and neck. Do not bring valuables to the hospital.    [facility] is not responsible for any belongings or valuables.  Contacts, dentures or bridgework may not be worn into surgery. Leave your suitcase in the car. After surgery it may be brought to your  room. For patients admitted to the hospital, discharge time is determined by your treatment team.   Patients discharged the day of surgery will not be allowed to drive home.   Make arrangements for someone to be with you for the first 24 hours of your Same Day Discharge.   __X__ Take these medicines the morning of surgery with A SIP OF WATER:    1. metoprolol succinate (TOPROL-XL) 50 MG   2.   3.   4.  5.  6.  ____ Fleet Enema (as directed)   ____ Use CHG Soap (or wipes) as directed  ____ Use Benzoyl Peroxide Gel as instructed  ____ Use inhalers on the day of surgery  ____ Stop metformin 2 days prior to surgery    ____ Take 1/2 of usual insulin dose the night before surgery. No insulin the morning          of surgery.   __X__ Call your PCP and ask when  to stop your ASPIRIN LOW DOSE 81 MG EC before your procedure.   __X__ Stop any anti-inflammatory drugs such as Ibuprofen, Aleve, Advil, naproxen, or aspirin based products such as Excedrin, BC powders and or Goody Powders.   __X__ Stop supplements until after surgery.    __X__ Do not start any herbal supplements before your procedure.    If you have any questions regarding your pre-procedure instructions,  Please call Pre-admit Testing at [phone].

## 2020-11-12 LAB — CULTURE, URINE COMPREHENSIVE

## 2020-11-15 ENCOUNTER — Telehealth: Payer: Self-pay

## 2020-11-15 DIAGNOSIS — B952 Enterococcus as the cause of diseases classified elsewhere: Secondary | ICD-10-CM

## 2020-11-15 MED ORDER — NITROFURANTOIN MONOHYD MACRO 100 MG PO CAPS: 100.0000 mg | ORAL_CAPSULE | Freq: Two times a day (BID) | ORAL | 0 refills | Status: AC

## 2020-11-15 NOTE — Telephone Encounter (Signed)
-----   Message from [name], MD sent at 11/15/2020  8:21 AM EDT ----- Based on sensitive, lets change him to nitrofurantoin 100 mg twice daily x5 days.  has HOLEP this Friday, thanks  [name], MD 11/15/2020

## 2020-11-15 NOTE — Telephone Encounter (Signed)
Called pt's daughter per DPR. Informed her of the information below and the need for pt to d/c amoxicillin and begin Macrobid. She voiced understanding. RX sent.

## 2020-11-15 NOTE — Telephone Encounter (Signed)
Called pt no answer. Unable to leave message as voicemail is full. 1st attempt.  

## 2020-11-16 ENCOUNTER — Other Ambulatory Visit: Payer: Self-pay

## 2020-11-16 ENCOUNTER — Other Ambulatory Visit: Payer: Medicaid Other

## 2020-11-16 ENCOUNTER — Other Ambulatory Visit
Admission: RE | Admit: 2020-11-16 | Discharge: 2020-11-16 | Disposition: A | Discharge status: Home / Self Care | Payer: Medicaid Other | Source: Ambulatory Visit | Attending: Urology | Admitting: Urology

## 2020-11-16 DIAGNOSIS — I10 Essential (primary) hypertension: Secondary | ICD-10-CM | POA: Insufficient documentation

## 2020-11-16 DIAGNOSIS — Z20822 Contact with and (suspected) exposure to covid-19: Secondary | ICD-10-CM | POA: Insufficient documentation

## 2020-11-16 DIAGNOSIS — Z01818 Encounter for other preprocedural examination: Secondary | ICD-10-CM | POA: Insufficient documentation

## 2020-11-16 LAB — SARS CORONAVIRUS 2 (TAT 6-24 HRS): SARS Coronavirus 2: NEGATIVE

## 2020-11-18 ENCOUNTER — Encounter: Payer: Self-pay | Admitting: Urology

## 2020-11-18 ENCOUNTER — Other Ambulatory Visit: Payer: Self-pay

## 2020-11-18 ENCOUNTER — Ambulatory Visit: Payer: Medicaid Other | Admitting: Registered Nurse

## 2020-11-18 ENCOUNTER — Encounter
Admission: RE | Disposition: A | Discharge status: Home / Self Care | Payer: Self-pay | Source: Home / Self Care | Attending: Urology

## 2020-11-18 ENCOUNTER — Ambulatory Visit
Admission: RE | Admit: 2020-11-18 | Discharge: 2020-11-18 | Disposition: A | Discharge status: Home / Self Care | Payer: Medicaid Other | Attending: Urology | Admitting: Urology

## 2020-11-18 DIAGNOSIS — Z85038 Personal history of other malignant neoplasm of large intestine: Secondary | ICD-10-CM | POA: Insufficient documentation

## 2020-11-18 DIAGNOSIS — N138 Other obstructive and reflux uropathy: Secondary | ICD-10-CM

## 2020-11-18 DIAGNOSIS — N401 Enlarged prostate with lower urinary tract symptoms: Secondary | ICD-10-CM | POA: Insufficient documentation

## 2020-11-18 DIAGNOSIS — N400 Enlarged prostate without lower urinary tract symptoms: Secondary | ICD-10-CM

## 2020-11-18 DIAGNOSIS — N39490 Overflow incontinence: Secondary | ICD-10-CM | POA: Insufficient documentation

## 2020-11-18 DIAGNOSIS — R3914 Feeling of incomplete bladder emptying: Secondary | ICD-10-CM | POA: Diagnosis not present

## 2020-11-18 HISTORY — PX: HOLEP-LASER ENUCLEATION OF THE PROSTATE WITH MORCELLATION: SHX6641

## 2020-11-18 LAB — GLUCOSE, CAPILLARY
Glucose-Capillary: 113 mg/dL — ABNORMAL HIGH (ref 70–99)
Glucose-Capillary: 108 mg/dL — ABNORMAL HIGH (ref 70–99)

## 2020-11-18 SURGERY — ENUCLEATION, PROSTATE, USING LASER, WITH MORCELLATION
Anesthesia: General

## 2020-11-18 MED ORDER — EPHEDRINE SULFATE 50 MG/ML IJ SOLN
INTRAMUSCULAR | Status: DC | PRN
  Administered 2020-11-18: 5 mg via INTRAVENOUS
  Administered 2020-11-18: 10 mg via INTRAVENOUS

## 2020-11-18 MED ORDER — CEFAZOLIN SODIUM-DEXTROSE 2-4 GM/100ML-% IV SOLN
INTRAVENOUS | Status: AC
  Filled 2020-11-18: qty 100

## 2020-11-18 MED ORDER — FAMOTIDINE 20 MG PO TABS
ORAL_TABLET | ORAL | Status: AC
  Administered 2020-11-18: 20 mg via ORAL
  Filled 2020-11-18: qty 1

## 2020-11-18 MED ORDER — DEXAMETHASONE SODIUM PHOSPHATE 10 MG/ML IJ SOLN
INTRAMUSCULAR | Status: AC
  Filled 2020-11-18: qty 1

## 2020-11-18 MED ORDER — PROPOFOL 10 MG/ML IV BOLUS
INTRAVENOUS | Status: AC
  Filled 2020-11-18: qty 20

## 2020-11-18 MED ORDER — LIDOCAINE HCL (CARDIAC) PF 100 MG/5ML IV SOSY
PREFILLED_SYRINGE | INTRAVENOUS | Status: DC | PRN
  Administered 2020-11-18: 100 mg via INTRAVENOUS

## 2020-11-18 MED ORDER — FENTANYL CITRATE (PF) 100 MCG/2ML IJ SOLN
INTRAMUSCULAR | Status: AC
  Administered 2020-11-18: 100 ug
  Filled 2020-11-18: qty 2

## 2020-11-18 MED ORDER — OXYCODONE HCL 5 MG/5ML PO SOLN: 5.0000 mg | Freq: Once | ORAL | Status: DC | PRN

## 2020-11-18 MED ORDER — ORAL CARE MOUTH RINSE: 15.0000 mL | Freq: Once | OROMUCOSAL | Status: AC

## 2020-11-18 MED ORDER — GLYCOPYRROLATE 0.2 MG/ML IJ SOLN
INTRAMUSCULAR | Status: DC | PRN
  Administered 2020-11-18: .2 mg via INTRAVENOUS

## 2020-11-18 MED ORDER — ACETAMINOPHEN 10 MG/ML IV SOLN: 1000.0000 mg | Freq: Once | INTRAVENOUS | Status: DC | PRN

## 2020-11-18 MED ORDER — CHLORHEXIDINE GLUCONATE 0.12 % MT SOLN
OROMUCOSAL | Status: AC
  Administered 2020-11-18: 15 mL via OROMUCOSAL
  Filled 2020-11-18: qty 15

## 2020-11-18 MED ORDER — FENTANYL CITRATE (PF) 100 MCG/2ML IJ SOLN
INTRAMUSCULAR | Status: DC | PRN
  Administered 2020-11-18: 50 ug via INTRAVENOUS

## 2020-11-18 MED ORDER — LABETALOL HCL 5 MG/ML IV SOLN
INTRAVENOUS | Status: DC | PRN
  Administered 2020-11-18 (×2): 5 mg via INTRAVENOUS

## 2020-11-18 MED ORDER — SUCCINYLCHOLINE CHLORIDE 20 MG/ML IJ SOLN
INTRAMUSCULAR | Status: DC | PRN
  Administered 2020-11-18: 120 mg via INTRAVENOUS

## 2020-11-18 MED ORDER — HYDRALAZINE HCL 20 MG/ML IJ SOLN
5.0000 mg | Freq: Once | INTRAMUSCULAR | Status: AC
  Administered 2020-11-18: 5 mg via INTRAVENOUS

## 2020-11-18 MED ORDER — CIPROFLOXACIN IN D5W 400 MG/200ML IV SOLN
INTRAVENOUS | Status: AC
  Filled 2020-11-18: qty 200

## 2020-11-18 MED ORDER — FENTANYL CITRATE (PF) 100 MCG/2ML IJ SOLN: 25.0000 ug | INTRAMUSCULAR | Status: DC | PRN

## 2020-11-18 MED ORDER — ONDANSETRON HCL 4 MG/2ML IJ SOLN
INTRAMUSCULAR | Status: AC
  Filled 2020-11-18: qty 2

## 2020-11-18 MED ORDER — LABETALOL HCL 5 MG/ML IV SOLN
INTRAVENOUS | Status: AC
  Filled 2020-11-18: qty 4

## 2020-11-18 MED ORDER — FENTANYL CITRATE (PF) 100 MCG/2ML IJ SOLN
INTRAMUSCULAR | Status: AC
  Filled 2020-11-18: qty 2

## 2020-11-18 MED ORDER — GLYCOPYRROLATE 0.2 MG/ML IJ SOLN
INTRAMUSCULAR | Status: AC
  Filled 2020-11-18: qty 1

## 2020-11-18 MED ORDER — ROCURONIUM BROMIDE 100 MG/10ML IV SOLN
INTRAVENOUS | Status: DC | PRN
  Administered 2020-11-18: 30 mg via INTRAVENOUS
  Administered 2020-11-18: 10 mg via INTRAVENOUS

## 2020-11-18 MED ORDER — ONDANSETRON HCL 4 MG/2ML IJ SOLN: 4.0000 mg | Freq: Once | INTRAMUSCULAR | Status: DC | PRN

## 2020-11-18 MED ORDER — SODIUM CHLORIDE 0.9 % IV SOLN: INTRAVENOUS | Status: DC

## 2020-11-18 MED ORDER — CHLORHEXIDINE GLUCONATE 0.12 % MT SOLN: 15.0000 mL | Freq: Once | OROMUCOSAL | Status: AC

## 2020-11-18 MED ORDER — EPHEDRINE 5 MG/ML INJ
INTRAVENOUS | Status: AC
  Filled 2020-11-18: qty 10

## 2020-11-18 MED ORDER — CIPROFLOXACIN IN D5W 400 MG/200ML IV SOLN
400.0000 mg | Freq: Once | INTRAVENOUS | Status: AC
  Administered 2020-11-18: 400 mg via INTRAVENOUS

## 2020-11-18 MED ORDER — BELLADONNA ALKALOIDS-OPIUM 16.2-60 MG RE SUPP
RECTAL | Status: AC
  Filled 2020-11-18: qty 1

## 2020-11-18 MED ORDER — SUGAMMADEX SODIUM 200 MG/2ML IV SOLN
INTRAVENOUS | Status: DC | PRN
  Administered 2020-11-18: 200 mg via INTRAVENOUS

## 2020-11-18 MED ORDER — ACETAMINOPHEN 10 MG/ML IV SOLN
INTRAVENOUS | Status: AC
  Filled 2020-11-18: qty 100

## 2020-11-18 MED ORDER — LIDOCAINE HCL (PF) 2 % IJ SOLN
INTRAMUSCULAR | Status: AC
  Filled 2020-11-18: qty 10

## 2020-11-18 MED ORDER — PHENYLEPHRINE HCL (PRESSORS) 10 MG/ML IV SOLN
INTRAVENOUS | Status: DC | PRN
  Administered 2020-11-18: 100 ug via INTRAVENOUS

## 2020-11-18 MED ORDER — HYDROCODONE-ACETAMINOPHEN 5-325 MG PO TABS: 1.0000 | ORAL_TABLET | ORAL | 0 refills | Status: AC | PRN

## 2020-11-18 MED ORDER — CEFAZOLIN SODIUM-DEXTROSE 2-4 GM/100ML-% IV SOLN: 2.0000 g | INTRAVENOUS | Status: DC

## 2020-11-18 MED ORDER — OXYCODONE HCL 5 MG PO TABS: 5.0000 mg | ORAL_TABLET | Freq: Once | ORAL | Status: DC | PRN

## 2020-11-18 MED ORDER — HYDRALAZINE HCL 20 MG/ML IJ SOLN
INTRAMUSCULAR | Status: AC
  Filled 2020-11-18: qty 1

## 2020-11-18 MED ORDER — ONDANSETRON HCL 4 MG/2ML IJ SOLN
INTRAMUSCULAR | Status: DC | PRN
  Administered 2020-11-18: 4 mg via INTRAVENOUS

## 2020-11-18 MED ORDER — FAMOTIDINE 20 MG PO TABS: 20.0000 mg | ORAL_TABLET | Freq: Once | ORAL | Status: AC

## 2020-11-18 MED ORDER — LIDOCAINE HCL (PF) 2 % IJ SOLN
INTRAMUSCULAR | Status: AC
  Filled 2020-11-18: qty 5

## 2020-11-18 MED ORDER — PROPOFOL 10 MG/ML IV BOLUS
INTRAVENOUS | Status: DC | PRN
  Administered 2020-11-18: 100 mg via INTRAVENOUS

## 2020-11-18 MED ORDER — DEXAMETHASONE SODIUM PHOSPHATE 10 MG/ML IJ SOLN
INTRAMUSCULAR | Status: DC | PRN
  Administered 2020-11-18: 4 mg via INTRAVENOUS

## 2020-11-18 MED ORDER — ACETAMINOPHEN 10 MG/ML IV SOLN
INTRAVENOUS | Status: DC | PRN
  Administered 2020-11-18: 1000 mg via INTRAVENOUS

## 2020-11-18 SURGICAL SUPPLY — 36 items
ADAPTER IRRIG TUBE 2 SPIKE SOL (ADAPTER) ×4 IMPLANT
ADPR TBG 2 SPK PMP STRL ASCP (ADAPTER) ×2
BAG DRN LRG CPC RND TRDRP CNTR (MISCELLANEOUS) ×1
BAG URO DRAIN 4000ML (MISCELLANEOUS) ×2 IMPLANT
CATH FOLEY 3WAY 30CC 24FR (CATHETERS) ×2
CATH URETL 5X70 OPEN END (CATHETERS) ×2 IMPLANT
CATH URTH STD 24FR FL 3W 2 (CATHETERS) ×1 IMPLANT
CONTAINER COLLECT MORCELLATR (MISCELLANEOUS) ×1 IMPLANT
DRAPE UTILITY 15X26 TOWEL STRL (DRAPES) IMPLANT
ELECT BIVAP BIPO 22/24 DONUT (ELECTROSURGICAL)
ELECTRD BIVAP BIPO 22/24 DONUT (ELECTROSURGICAL) IMPLANT
FIBER LASER FLEXIVA PULSE 550 (Laser) ×2 IMPLANT
FILTER OVERFLOW MORCELLATOR (FILTER) ×1 IMPLANT
GLOVE SURG UNDER POLY LF SZ7.5 (GLOVE) ×2 IMPLANT
GOWN STRL REUS W/ TWL LRG LVL3 (GOWN DISPOSABLE) ×1 IMPLANT
GOWN STRL REUS W/ TWL XL LVL3 (GOWN DISPOSABLE) ×1 IMPLANT
GOWN STRL REUS W/TWL LRG LVL3 (GOWN DISPOSABLE) ×2
GOWN STRL REUS W/TWL XL LVL3 (GOWN DISPOSABLE) ×2
HOLDER FOLEY CATH W/STRAP (MISCELLANEOUS) ×2 IMPLANT
IV NS IRRIG 3000ML ARTHROMATIC (IV SOLUTION) ×8 IMPLANT
KIT TURNOVER CYSTO (KITS) ×2 IMPLANT
MANIFOLD NEPTUNE II (INSTRUMENTS) ×2 IMPLANT
MBRN O SEALING YLW 17 FOR INST (MISCELLANEOUS) ×2
MEMBRANE SLNG YLW 17 FOR INST (MISCELLANEOUS) ×1 IMPLANT
MORCELLATOR COLLECT CONTAINER (MISCELLANEOUS) ×2
MORCELLATOR OVERFLOW FILTER (FILTER) ×2
MORCELLATOR ROTATION 4.75 335 (MISCELLANEOUS) ×2 IMPLANT
PACK CYSTO AR (MISCELLANEOUS) ×2 IMPLANT
SET CYSTO W/LG BORE CLAMP LF (SET/KITS/TRAYS/PACK) ×2 IMPLANT
SET IRRIG Y TYPE TUR BLADDER L (SET/KITS/TRAYS/PACK) ×2 IMPLANT
SLEEVE PROTECTION STRL DISP (MISCELLANEOUS) ×4 IMPLANT
SURGILUBE 2OZ TUBE FLIPTOP (MISCELLANEOUS) ×2 IMPLANT
SYR TOOMEY IRRIG 70ML (MISCELLANEOUS) ×2
SYRINGE TOOMEY IRRIG 70ML (MISCELLANEOUS) ×1 IMPLANT
TUBE PUMP MORCELLATOR PIRANHA (TUBING) ×2 IMPLANT
WATER STERILE IRR 1000ML POUR (IV SOLUTION) ×2 IMPLANT

## 2020-11-18 NOTE — Transfer of Care (Signed)
Immediate Anesthesia Transfer of Care Note  Patient: Dalton Villarreal  Procedure(s) Performed: HOLEP-LASER ENUCLEATION OF THE PROSTATE WITH MORCELLATION (N/A )  Patient Location: PACU  Anesthesia Type:General  Level of Consciousness: sedated  Airway & Oxygen Therapy: Patient Spontanous Breathing and Patient connected to face mask oxygen  Post-op Assessment: Report given to RN and Post -op Vital signs reviewed and stable  Post vital signs: Reviewed and stable  Last Vitals:  Vitals Value Taken Time  BP 185/110 11/18/20 0900  Temp    Pulse 58 11/18/20 0906  Resp 10 11/18/20 0906  SpO2 100 % 11/18/20 0906  Vitals shown include unvalidated device data.  Last Pain:  Vitals:   11/18/20 0655  PainSc: 0-No pain         Complications: No complications documented.

## 2020-11-18 NOTE — H&P (Signed)
   11/18/20 6:59 AM   Dalton Villarreal 1946-02-20 [id number]  CC: BPH  HPI: 75 year old male with 70 g prostate on CT and long history of incomplete emptying based on prior CT scan, PVRs ~56mL, now with worsening urinary symptoms of frequency, urge incontinence, and overflow incontinence secondary to incomplete emptying and BPH.   PMH: Past Medical History:  Diagnosis Date  . Cancer [facility])    colon cancer  . Diabetes mellitus without complication ([place])   . Hypercholesteremia   . Hypertension   . Ruptured lumbar disc     Surgical History: Past Surgical History:  Procedure Laterality Date  . COLON SURGERY    . COLONOSCOPY    . COLONOSCOPY WITH PROPOFOL N/A 04/21/2019   Procedure: COLONOSCOPY WITH PROPOFOL;  Surgeon: [name], MD;  Location[facility] ENDOSCOPY;  Service: Gastroenterology;  Laterality: N/A;  . COLONOSCOPY WITH PROPOFOL N/A 05/06/2020   Procedure: COLONOSCOPY WITH PROPOFOL;  Surgeon: [name], MD;  Location: [facility] ENDOSCOPY;  Service: Gastroenterology;  Laterality: N/A;  . EYE SURGERY Bilateral    cataracts bilaterally  . LAPAROSCOPIC RIGHT COLECTOMY N/A 05/26/2019   Procedure: LAPAROSCOPIC RIGHT COLECTOMY;  Surgeon: Jules Husbands, MD;  Location: [facility] ORS;  Service: General;  Laterality: N/A;  . LUMBAR LAMINECTOMY    . UPPER GI ENDOSCOPY       Family History: Family History  Problem Relation Age of Onset  . Breast cancer Mother   . Prostate cancer Brother     Social History:  reports that he has never smoked. He has never used smokeless tobacco. He reports previous alcohol use. He reports that he does not use drugs.  Physical Exam: BP (!) 174/105   Pulse 77   Temp (!) 97.4 F (36.3 C)   Resp 18   SpO2 97%    Constitutional:  Alert and oriented, No acute distress. Cardiovascular: Regular rate and rhythm Respiratory: Clear to auscultation bilaterally GI: Abdomen is soft, nontender, nondistended, no abdominal masses  Laboratory Data: Culture 3/28  with enterococcus and coag negative staph, has been on culture appropriate nitrofurantoin  Assessment & Plan:   We discussed options including chronic Foley catheter, intermittent catheterization, or an outlet procedure.  With his incomplete emptying and very elevated PVRs as well as large prostate, think he is best suited by HOLEP.  We discussed the risks and benefits of HoLEP at length.  The procedure requires general anesthesia and takes 2 to 3 hours, and a holmium laser is used to enucleate the prostate and push this tissue into the bladder.  A morcellator is then used to remove this tissue, which is sent for pathology.  The vast majority of patients are able to discharge the same day with a catheter in place for 2 to 3 days, and will follow-up in clinic for a voiding trial.  Approximately 5% of patients will be admitted overnight to monitor the urine, or if they have multiple co-morbidities.  We specifically discussed the risks of bleeding, infection, retrograde ejaculation, temporary urgency and urge incontinence, very low risk of long-term incontinence, pathologic evaluation of prostate tissue and possible detection of prostate cancer or other malignancy, and possible need for additional procedures.  HoLEP today  [name], MD 11/18/2020  [facility] [address], [place], [place] [address] [phone]

## 2020-11-18 NOTE — Anesthesia Procedure Notes (Signed)
Procedure Name: Intubation Date/Time: 11/18/2020 7:35 AM Performed by: Hedda Slade, CRNA Pre-anesthesia Checklist: Patient identified, Patient being monitored, Timeout performed, Emergency Drugs available and Suction available Patient Re-evaluated:Patient Re-evaluated prior to induction Oxygen Delivery Method: Circle system utilized Preoxygenation: Pre-oxygenation with 100% oxygen Induction Type: IV induction Ventilation: Mask ventilation without difficulty and Oral airway inserted - appropriate to patient size Laryngoscope Size: 3 and McGraph Grade View: Grade I Tube type: Oral Tube size: 7.0 mm Number of attempts: 1 Airway Equipment and Method: Stylet Placement Confirmation: ETT inserted through vocal cords under direct vision,  positive ETCO2 and breath sounds checked- equal and bilateral Secured at: 21 cm Tube secured with: Tape Dental Injury: Teeth and Oropharynx as per pre-operative assessment

## 2020-11-18 NOTE — Op Note (Signed)
Date of procedure: 11/18/20  Preoperative diagnosis:  1. BPH with incomplete bladder emptying, overflow incontinence  Postoperative diagnosis:  1. Same  Procedure: 1. HoLEP (Holmium Laser Enucleation of the Prostate)  Surgeon: [name], MD  Anesthesia: General  Complications: None  Intraoperative findings:  1.  Moderate size prostate with obstructing lateral and median lobes 2.  Ureteral orifices and verumontanum intact at conclusion of case 3.  Excellent hemostasis  EBL: Minimal  Specimens: Prostate chips  Enucleation time: 24 minutes  Morcellation time: 11 minutes  Drains: 24 French three-way, 50 cc in balloon  Indication: Dalton Villarreal is a 75 y.o. patient with BPH and long-term incomplete bladder emptying who developed overflow incontinence with PVRs > 500 mL.  After reviewing the management options for treatment, they elected to proceed with the above surgical procedure(s). We have discussed the potential benefits and risks of the procedure, side effects of the proposed treatment, the likelihood of the patient achieving the goals of the procedure, and any potential problems that might occur during the procedure or recuperation.  We specifically discussed the risks of bleeding, infection, hematuria and clot retention, need for additional procedures, possible overnight hospital stay, temporary urgency and incontinence, rare long-term incontinence, and retrograde ejaculation.  Informed consent has been obtained.   Description of procedure:  The patient was taken to the operating room and general anesthesia was induced.  The patient was placed in the dorsal lithotomy position, prepped and draped in the usual sterile fashion, and preoperative antibiotics(Cipro) were administered.  SCDs were placed for DVT prophylaxis.  A preoperative time-out was performed.   Dalton Villarreal sounds were used to gently dilated the urethra up to 40F. The 34 French continuous flow resectoscope was  inserted into the urethra using the visual obturator  The prostate was moderate in size with obstructing lateral lobes and a large median lobe. The bladder was thoroughly inspected and notable for mild trabeculations but no suspicious lesions.  The ureteral orifices were located in orthotopic position.  The laser was set to 2 J and 50 Hz and was used to make a lambda incision just proximal to the verumontanum down to the level of the capsule.  A 6 o'clock incision was then made down to the level of the capsule from the bladder neck to the verumontanum.  The lateral lobes were then incised circumferentially until they were disconnected from the surrounding tissue.  The capsule was examined and laser was used for meticulous hemostasis.    The 66 French resectoscope was then switched out for the 37 French nephroscope and the lobes were morcellated and the tissue sent to pathology.  A 24 French three-way catheter was inserted easily, and 50 cc were placed in the balloon.  Urine was clear.  The catheter irrigated easily with a Toomey syringe.  CBI was initiated. A belladonna suppository was placed.  The patient tolerated the procedure well without any immediate complications and was extubated and transferred to the recovery room in stable condition.  Urine was clear on fast CBI.  Disposition: Stable to PACU  Plan: Wean CBI in PACU, anticipate discharge home today with void trial in clinic in 2-3 days   [name], MD 11/18/2020

## 2020-11-18 NOTE — Anesthesia Postprocedure Evaluation (Signed)
Anesthesia Post Note  Patient: Dalton Villarreal  Procedure(s) Performed: HOLEP-LASER ENUCLEATION OF THE PROSTATE WITH MORCELLATION (N/A )  Patient location during evaluation: PACU Anesthesia Type: General Level of consciousness: awake and alert Pain management: pain level controlled Vital Signs Assessment: post-procedure vital signs reviewed and stable Respiratory status: spontaneous breathing, nonlabored ventilation, respiratory function stable and patient connected to nasal cannula oxygen Cardiovascular status: blood pressure returned to baseline and stable (patient's blood pressure high though better after hydralazine administration. Close to preop blood pressure. Patient asymptomatic. Advised patient to take his normal home BP meds and to return to ED if concerning symptoms like chest pain or headache develo) Postop Assessment: no apparent nausea or vomiting Anesthetic complications: no   No complications documented.   Last Vitals:  Vitals:   11/18/20 0915 11/18/20 0930  BP: (!) 148/111 (!) 149/110  Pulse: 64 64  Resp: 15 11  Temp: (!) 36.1 C   SpO2: 98% 98%    Last Pain:  Vitals:   11/18/20 0900  PainSc: Asleep                 Arita Miss

## 2020-11-18 NOTE — Progress Notes (Signed)
Dalton Villarreal presents with htn preoperative and postoperative. Was treated intraop with labetalol. According to Dalton Faries, he has been out of his hydrochlorothiazide and has taken his other b/p meds or Metformin for a few days. I had a conversation with him, his brother Wille Glaser and his daughter about the risks of being noncompliant with his medications. They are aware and I advised him to take them today and get the HCTZ and Metformin refilled and if there were any concerns getting them filled to contact his primary Dr that prescribes those medications to him.

## 2020-11-18 NOTE — Anesthesia Preprocedure Evaluation (Signed)
Anesthesia Evaluation  Patient identified by MRN, date of birth, ID band Patient awake    Reviewed: Allergy & Precautions, NPO status , Patient's Chart, lab work & pertinent test results, reviewed documented beta blocker date and time   History of Anesthesia Complications Negative for: history of anesthetic complications  Airway Mallampati: III  TM Distance: >3 FB Neck ROM: Full    Dental  (+) Chipped, Edentulous Upper, Dental Advidsory Given   Pulmonary shortness of breath and with exertion, neg sleep apnea, neg COPD, Patient abstained from smoking.Not current smoker,    Pulmonary exam normal breath sounds clear to auscultation       Cardiovascular Exercise Tolerance: Poor METShypertension, Pt. on medications and Pt. on home beta blockers (-) angina(-) CAD, (-) Past MI and (-) Cardiac Stents Normal cardiovascular exam(-) dysrhythmias (-) Valvular Problems/Murmurs Rhythm:Regular Rate:Normal - Systolic murmurs    Neuro/Psych negative neurological ROS  negative psych ROS   GI/Hepatic negative GI ROS, Neg liver ROS, neg GERD  ,  Endo/Other  diabetes, Type 2  Renal/GU negative Renal ROS  negative genitourinary   Musculoskeletal  (+) Arthritis , Osteoarthritis,    Abdominal Normal abdominal exam  (+)   Peds negative pediatric ROS (+)  Hematology negative hematology ROS (+)   Anesthesia Other Findings Past Medical History: No date: Cancer ([place])     Comment:  colon cancer No date: Diabetes mellitus without complication ([place]) No date: Hypercholesteremia No date: Hypertension No date: Ruptured lumbar disc   Reproductive/Obstetrics                             Anesthesia Physical  Anesthesia Plan  ASA: II  Anesthesia Plan: General   Post-op Pain Management:    Induction: Intravenous  PONV Risk Score and Plan: 3 and Treatment may vary due to age or medical condition, Ondansetron and  Dexamethasone  Airway Management Planned: Oral ETT  Additional Equipment: None  Intra-op Plan:   Post-operative Plan: Extubation in OR  Informed Consent: I have reviewed the patients History and Physical, chart, labs and discussed the procedure including the risks, benefits and alternatives for the proposed anesthesia with the patient or authorized representative who has indicated his/her understanding and acceptance.     Dental advisory given  Plan Discussed with: CRNA  Anesthesia Plan Comments: (Discussed risks of anesthesia with patient, including PONV, sore throat, lip/dental damage. Rare risks discussed as well, such as cardiorespiratory and neurological sequelae. Patient understands.)        Anesthesia Quick Evaluation

## 2020-11-18 NOTE — Discharge Instructions (Addendum)

## 2020-11-21 ENCOUNTER — Ambulatory Visit: Payer: Medicaid Other | Admitting: Physician Assistant

## 2020-11-21 ENCOUNTER — Other Ambulatory Visit: Payer: Self-pay

## 2020-11-21 ENCOUNTER — Ambulatory Visit (INDEPENDENT_AMBULATORY_CARE_PROVIDER_SITE_OTHER): Payer: Medicaid Other | Admitting: Physician Assistant

## 2020-11-21 DIAGNOSIS — N138 Other obstructive and reflux uropathy: Secondary | ICD-10-CM

## 2020-11-21 DIAGNOSIS — N401 Enlarged prostate with lower urinary tract symptoms: Secondary | ICD-10-CM | POA: Diagnosis not present

## 2020-11-21 LAB — BLADDER SCAN AMB NON-IMAGING

## 2020-11-21 LAB — SURGICAL PATHOLOGY

## 2020-11-21 NOTE — Progress Notes (Signed)
Afternoon follow-up  Patient returned to clinic this afternoon for repeat PVR. He reports drinking approximately 33oz of fluid. He has been able to urinate. He has not had urinary leakage. PVR 226mL.  Results for orders placed or performed in visit on 11/21/20  BLADDER SCAN AMB NON-IMAGING  Result Value Ref Range   Scan Result 27mL     Voiding trial passed. Counseled patient on normal postoperative findings including dysuria, gross hematuria, and urinary leakage.  Counseled him to start Kegel exercises 3x10 sets daily and provided written and verbal instructions on how to complete these.  Shared negative surgical pathology results with the patient.  He expressed understanding.  I encouraged him to follow-up in clinic if he develops the inability to urinate associated with lower abdominal discomfort sooner than his scheduled 10-week postop follow-up.  Follow up: Return if symptoms worsen or fail to improve.

## 2020-11-21 NOTE — Patient Instructions (Addendum)
Congratulations on your recent HOLEP procedure! As discussed in clinic today, there are three main side effects that commonly occur after surgery: 1. Burning or pain with urination: This typically resolves within 1 week of surgery. If you are still having significant pain with urination 10 days after surgery, please call our clinic. We may need to check you for a urinary tract infection at that point, though this is rare. 2. Blood in the urine: This may come and go, but typically resolves completely within 3 weeks of surgery. If you are on blood thinners, it may take longer for the bleeding to resolve. As long as your urine remains thin and runny and you are not passing large clots (around the size of your palm), this is a normal postoperative finding. If you start to pass dark red urine or thick, ketchup-like urine, please call our office immediately. 3. Urinary leakage or urgency: This tends to improve with time, with most patients becoming dry within around 3 months of surgery. You may wear absorbant underwear or liners for security during this time. To help you get dry faster, please make sure you are completing your Kegel exercises as instructed, with a set of 10 exercises completed up to three times daily.  You are scheduled for postop follow-up with Dr. Diamantina Providence in about 10 weeks. If you begin to have difficulty urinating or lower abdominal pain before then, please call our clinic immediately or go to the Emergency Department if outside office hours (8a-5p Monday-Friday).   Kegel Exercises  Kegel exercises can help strengthen your pelvic floor muscles. The pelvic floor is a group of muscles that support your rectum, small intestine, and bladder. In females, pelvic floor muscles also help support the womb (uterus). These muscles help you control the flow of urine and stool. Kegel exercises are painless and simple, and they do not require any equipment. Your provider may suggest Kegel exercises  to:  Improve bladder and bowel control.  Improve sexual response.  Improve weak pelvic floor muscles after surgery to remove the uterus (hysterectomy) or pregnancy (females).  Improve weak pelvic floor muscles after prostate gland removal or surgery (males). Kegel exercises involve squeezing your pelvic floor muscles, which are the same muscles you squeeze when you try to stop the flow of urine or keep from passing gas. The exercises can be done while sitting, standing, or lying down, but it is best to vary your position. Exercises How to do Kegel exercises: 1. Squeeze your pelvic floor muscles tight. You should feel a tight lift in your rectal area. If you are a male, you should also feel a tightness in your vaginal area. Keep your stomach, buttocks, and legs relaxed. 2. Hold the muscles tight for up to 10 seconds. 3. Breathe normally. 4. Relax your muscles. 5. Repeat as told by your health care provider. Repeat this exercise daily as told by your health care provider. Continue to do this exercise for at least 4-6 weeks, or for as long as told by your health care provider. You may be referred to a physical therapist who can help you learn more about how to do Kegel exercises. Depending on your condition, your health care provider may recommend:  Varying how long you squeeze your muscles.  Doing several sets of exercises every day.  Doing exercises for several weeks.  Making Kegel exercises a part of your regular exercise routine. This information is not intended to replace advice given to you by your health care provider. Make  sure you discuss any questions you have with your health care provider. Document Revised: 12/04/2019 Document Reviewed: 03/19/2018 Elsevier Patient Education  [place].

## 2020-11-21 NOTE — Progress Notes (Signed)
Catheter Removal  Patient is present today for a catheter removal.  77ml of water was drained from the balloon. A 24FR three-way foley cath was removed from the bladder no complications were noted . Patient tolerated well.  Performed by: Debroah Loop, PA-C   Follow up/ Additional notes: Push fluids and RTC this afternoon for PVR.

## 2020-11-22 ENCOUNTER — Telehealth: Payer: Self-pay

## 2020-11-22 NOTE — Telephone Encounter (Signed)
Called pt informed him of the information below. Pt gave verbal understanding.  

## 2020-11-22 NOTE — Telephone Encounter (Signed)
-----   Message from [name], MD sent at 11/22/2020  8:20 AM EDT ----- Good news, no cancer seen on HOLEP tissue, keep follow-up as scheduled  [name], MD 11/22/2020

## 2021-01-10 ENCOUNTER — Telehealth: Payer: Self-pay | Admitting: Urology

## 2021-01-10 NOTE — Telephone Encounter (Signed)
Pt called asking if blood in urine was normal after surgery.  I sent secure chat to [facility] who said it was for pt to drink plenty of fluids and keep follow up as scheduled.  This should improve 2-3 months after surgery.

## 2021-01-25 ENCOUNTER — Ambulatory Visit (INDEPENDENT_AMBULATORY_CARE_PROVIDER_SITE_OTHER): Payer: Medicaid Other | Admitting: Urology

## 2021-01-25 ENCOUNTER — Other Ambulatory Visit: Payer: Self-pay

## 2021-01-25 ENCOUNTER — Encounter: Payer: Self-pay | Admitting: Urology

## 2021-01-25 VITALS — BP 163/96 | HR 64 | Ht 64.0 in | Wt 175.0 lb

## 2021-01-25 DIAGNOSIS — N138 Other obstructive and reflux uropathy: Secondary | ICD-10-CM | POA: Diagnosis not present

## 2021-01-25 DIAGNOSIS — N401 Enlarged prostate with lower urinary tract symptoms: Secondary | ICD-10-CM

## 2021-01-25 LAB — BLADDER SCAN AMB NON-IMAGING: Scan Result: 14

## 2021-01-25 NOTE — Patient Instructions (Signed)
Kegel Exercises  Kegel exercises can help strengthen your pelvic floor muscles. The pelvic floor is a group of muscles that support your rectum, small intestine, and bladder. In females, pelvic floor muscles also help support the womb (uterus). These muscles help you control the flow of urine and stool. Kegel exercises are painless and simple, and they do not require any equipment. Your provider may suggest Kegel exercises to: Improve bladder and bowel control. Improve sexual response. Improve weak pelvic floor muscles after surgery to remove the uterus (hysterectomy) or pregnancy (females). Improve weak pelvic floor muscles after prostate gland removal or surgery (males). Kegel exercises involve squeezing your pelvic floor muscles, which are the same muscles you squeeze when you try to stop the flow of urine or keep from passing gas. The exercises can be done while sitting, standing, or lying down, but itis best to vary your position. Exercises How to do Kegel exercises: Squeeze your pelvic floor muscles tight. You should feel a tight lift in your rectal area. If you are a male, you should also feel a tightness in your vaginal area. Keep your stomach, buttocks, and legs relaxed. Hold the muscles tight for up to 10 seconds. Breathe normally. Relax your muscles. Repeat as told by your health care provider. Repeat this exercise daily as told by your health care provider. Continue to do this exercise for at least 4-6 weeks, or for as long as told by your healthcare provider. You may be referred to a physical therapist who can help you learn more abouthow to do Kegel exercises. Depending on your condition, your health care provider may recommend: Varying how long you squeeze your muscles. Doing several sets of exercises every day. Doing exercises for several weeks. Making Kegel exercises a part of your regular exercise routine. This information is not intended to replace advice given to you by  your health care provider. Make sure you discuss any questions you have with your healthcare provider. Document Revised: 07/20/2020 Document Reviewed: 03/19/2018 Elsevier Patient Education  2022 [employer]  

## 2021-01-25 NOTE — Progress Notes (Signed)
   01/25/2021 9:39 AM   Dalton Villarreal [birth year] [id number]  Reason for visit: Follow up BPH status post HOLEP  HPI: 75 year old male with centigram prostate on CT in 1 history of incomplete emptying with PVRs ~526mL.  Developed worsening urinary symptoms with frequency, urge incontinence, and overflow incontinence and opted for an outlet procedure.  Pathology showed only benign prostate tissue.  He is doing very well since surgery.  He is urinating with a much stronger stream and denies significant incontinence.  He has some mild postvoid dribbling.  PVR is normal today at 14 mL.  He said some intermittent hematuria after surgery that is improving.  He denies any dysuria.  Postop expectations discussed, RTC 9 months PVR, can follow-up as needed at that point doing well   [name], MD  [facility] [address] [place], [place] [address] [phone]

## 2021-03-06 ENCOUNTER — Inpatient Hospital Stay: Payer: Medicaid Other

## 2021-03-08 ENCOUNTER — Inpatient Hospital Stay: Payer: Medicaid Other | Admitting: Oncology

## 2021-03-10 ENCOUNTER — Inpatient Hospital Stay: Payer: Medicaid Other | Attending: Oncology

## 2021-03-10 ENCOUNTER — Other Ambulatory Visit: Payer: Self-pay

## 2021-03-10 DIAGNOSIS — C182 Malignant neoplasm of ascending colon: Secondary | ICD-10-CM | POA: Diagnosis not present

## 2021-03-10 LAB — CBC WITH DIFFERENTIAL/PLATELET
Abs Immature Granulocytes: 0.06 10*3/uL (ref 0.00–0.07)
Basophils Absolute: 0 10*3/uL (ref 0.0–0.1)
Basophils Relative: 1 %
Eosinophils Absolute: 0.2 10*3/uL (ref 0.0–0.5)
Eosinophils Relative: 5 %
HCT: 40.3 % (ref 39.0–52.0)
Hemoglobin: 14 g/dL (ref 13.0–17.0)
Immature Granulocytes: 2 %
Lymphocytes Relative: 43 %
Lymphs Abs: 1.5 10*3/uL (ref 0.7–4.0)
MCH: 32.4 pg (ref 26.0–34.0)
MCHC: 34.7 g/dL (ref 30.0–36.0)
MCV: 93.3 fL (ref 80.0–100.0)
Monocytes Absolute: 0.4 10*3/uL (ref 0.1–1.0)
Monocytes Relative: 11 %
Neutro Abs: 1.3 10*3/uL — ABNORMAL LOW (ref 1.7–7.7)
Neutrophils Relative %: 38 %
Platelets: 156 10*3/uL (ref 150–400)
RBC: 4.32 MIL/uL (ref 4.22–5.81)
RDW: 13.5 % (ref 11.5–15.5)
WBC: 3.3 10*3/uL — ABNORMAL LOW (ref 4.0–10.5)
nRBC: 0 % (ref 0.0–0.2)

## 2021-03-10 LAB — COMPREHENSIVE METABOLIC PANEL
ALT: 32 U/L (ref 0–44)
AST: 31 U/L (ref 15–41)
Albumin: 4 g/dL (ref 3.5–5.0)
Alkaline Phosphatase: 64 U/L (ref 38–126)
Anion gap: 9 (ref 5–15)
BUN: 11 mg/dL (ref 8–23)
CO2: 32 mmol/L (ref 22–32)
Calcium: 9.2 mg/dL (ref 8.9–10.3)
Chloride: 95 mmol/L — ABNORMAL LOW (ref 98–111)
Creatinine, Ser: 1.3 mg/dL — ABNORMAL HIGH (ref 0.61–1.24)
GFR, Estimated: 58 mL/min — ABNORMAL LOW (ref >60)
Glucose, Bld: 148 mg/dL — ABNORMAL HIGH (ref 70–99)
Potassium: 3.3 mmol/L — ABNORMAL LOW (ref 3.5–5.1)
Sodium: 136 mmol/L (ref 135–145)
Total Bilirubin: 0.7 mg/dL (ref 0.3–1.2)
Total Protein: 7.3 g/dL (ref 6.5–8.1)

## 2021-03-11 LAB — CEA: CEA: 3.2 ng/mL (ref 0.0–4.7)

## 2021-03-28 ENCOUNTER — Inpatient Hospital Stay: Payer: Medicaid Other | Attending: Oncology | Admitting: Oncology

## 2021-03-28 ENCOUNTER — Other Ambulatory Visit: Payer: Self-pay

## 2021-03-28 ENCOUNTER — Encounter: Payer: Self-pay | Admitting: Oncology

## 2021-03-28 VITALS — BP 122/83 | HR 59 | Temp 97.5°F | Resp 16 | Ht 64.0 in | Wt 181.0 lb

## 2021-03-28 DIAGNOSIS — Z803 Family history of malignant neoplasm of breast: Secondary | ICD-10-CM | POA: Diagnosis not present

## 2021-03-28 DIAGNOSIS — Z8042 Family history of malignant neoplasm of prostate: Secondary | ICD-10-CM | POA: Diagnosis not present

## 2021-03-28 DIAGNOSIS — N189 Chronic kidney disease, unspecified: Secondary | ICD-10-CM | POA: Diagnosis not present

## 2021-03-28 DIAGNOSIS — I2510 Atherosclerotic heart disease of native coronary artery without angina pectoris: Secondary | ICD-10-CM | POA: Insufficient documentation

## 2021-03-28 DIAGNOSIS — Z9049 Acquired absence of other specified parts of digestive tract: Secondary | ICD-10-CM | POA: Diagnosis not present

## 2021-03-28 DIAGNOSIS — Z809 Family history of malignant neoplasm, unspecified: Secondary | ICD-10-CM | POA: Insufficient documentation

## 2021-03-28 DIAGNOSIS — Z800 Family history of malignant neoplasm of digestive organs: Secondary | ICD-10-CM | POA: Diagnosis not present

## 2021-03-28 DIAGNOSIS — C182 Malignant neoplasm of ascending colon: Secondary | ICD-10-CM | POA: Diagnosis not present

## 2021-03-28 DIAGNOSIS — Z79899 Other long term (current) drug therapy: Secondary | ICD-10-CM | POA: Diagnosis not present

## 2021-03-28 DIAGNOSIS — E876 Hypokalemia: Secondary | ICD-10-CM | POA: Diagnosis not present

## 2021-03-29 NOTE — Progress Notes (Signed)
Hematology/Oncology  Follow up note [facility] [phone] [phone]   Patient Care Team: Donnie Coffin, MD as PCP - General (Family Medicine) Clent Jacks, RN as Oncology Nurse Navigator  REFERRING PROVIDER: Donnie Coffin, MD  CHIEF COMPLAINTS/REASON FOR VISIT:  Follow up for colon cancer  HISTORY OF PRESENTING ILLNESS:   Dalton Villarreal is a  75 y.o.  male with PMH listed below was seen in consultation at the request of  Donnie Coffin, MD  for evaluation of colon cancer Patient recently had a routine screening colonoscopy on 04/21/2019. 04/21/2007 colonoscopy showed a fungating nonobstructive large mass was found in the proximal ascending colon.  The mass was not circumferential.  Mass was biopsied. 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 Patient reports he is feeling well at baseline.  Denies any fever, chills, unintentional weight loss, change of bowel habit, blood in the stool, or abdominal pain.  Family history positive for brother with prostate cancer, mother passed away from breast cancer.  #Family history of cancer, recommend genetic testing.  Patient wants to defer.  #Radiographic evidence of CAD on CT scanning.  Discussed with patient.  Lifestyle modification discussed.  Recommend patient to continue follow-up with primary care physician for further management.  # 05/26/2019 patient underwent right hemicolectomy. Pathology positive for invasive adenocarcinoma.  21 lymph node harvested.  Lymphovascular invasion present.  Perineural invasion not identified.  Tumor deposits not identified.  All margins are negative.  pT1 pN0  # 1 year post surgery colonoscopy -05/06/2020, patient has had colonoscopy done by Dr. Vicente Males.  Healthy-appearing mucosa at the end to end ileocolonic anastomosis.  Examination was otherwise normal.  No specimens were collected.  INTERVAL HISTORY Dalton Villarreal is a 75 y.o.  male who has above history reviewed by me today presents for follow up visit for management of colon cancer Patient reports feeling well today.  No new complaints.  Denies any bowel habit changes, bloody or black stool.   Review of Systems  Constitutional:  Negative for appetite change, chills, fatigue, fever and unexpected weight change.  HENT:   Negative for hearing loss and voice change.   Eyes:  Negative for eye problems and icterus.  Respiratory:  Negative for chest tightness, cough and shortness of breath.   Cardiovascular:  Negative for chest pain and leg swelling.  Gastrointestinal:  Negative for abdominal distention and abdominal pain.  Endocrine: Negative for hot flashes.  Genitourinary:  Negative for difficulty urinating, dysuria and frequency.   Musculoskeletal:  Negative for arthralgias.  Skin:  Negative for itching and rash.  Neurological:  Negative for light-headedness and numbness.  Hematological:  Negative for adenopathy. Does not bruise/bleed easily.  Psychiatric/Behavioral:  Negative for confusion.    MEDICAL HISTORY:  Past Medical History:  Diagnosis Date   Cancer [facility])    colon cancer   Diabetes mellitus without complication ([place])    Hypercholesteremia    Hypertension    Ruptured lumbar disc     SURGICAL HISTORY: Past Surgical History:  Procedure Laterality Date   COLON SURGERY     COLONOSCOPY     COLONOSCOPY WITH PROPOFOL N/A 04/21/2019   Procedure: COLONOSCOPY WITH PROPOFOL;  Surgeon: [name], MD;  Location: [facility] ENDOSCOPY;  Service: Gastroenterology;  Laterality: N/A;   COLONOSCOPY WITH PROPOFOL N/A 05/06/2020   Procedure: COLONOSCOPY WITH PROPOFOL;  Surgeon: [name], MD;  Location: [facility] ENDOSCOPY;  Service: Gastroenterology;  Laterality: N/A;   EYE SURGERY Bilateral  cataracts bilaterally   HOLEP-LASER ENUCLEATION OF THE PROSTATE WITH MORCELLATION N/A 11/18/2020   Procedure: HOLEP-LASER ENUCLEATION OF THE PROSTATE WITH MORCELLATION;  Surgeon:  [name], MD;  Location: [facility] ORS;  Service: Urology;  Laterality: N/A;   LAPAROSCOPIC RIGHT COLECTOMY N/A 05/26/2019   Procedure: LAPAROSCOPIC RIGHT COLECTOMY;  Surgeon: Jules Husbands, MD;  Location: [facility] ORS;  Service: General;  Laterality: N/A;   LUMBAR LAMINECTOMY     UPPER GI ENDOSCOPY      SOCIAL HISTORY: Social History   Socioeconomic History   Marital status: Legally Separated    Spouse name: Not on file   Number of children: Not on file   Years of education: Not on file   Highest education level: Not on file  Occupational History   Not on file  Tobacco Use   Smoking status: Never   Smokeless tobacco: Never  Vaping Use   Vaping Use: Never used  Substance and Sexual Activity   Alcohol use: Not Currently   Drug use: Never   Sexual activity: Yes    Birth control/protection: None  Other Topics Concern   Not on file  Social History Narrative   Not on file   Social Determinants of Health   Financial Resource Strain: Not on file  Food Insecurity: Not on file  Transportation Needs: Not on file  Physical Activity: Not on file  Stress: Not on file  Social Connections: Not on file  Intimate Partner Violence: Not on file    FAMILY HISTORY: Family History  Problem Relation Age of Onset   Breast cancer Mother    Prostate cancer Brother     ALLERGIES:  has No Known Allergies.  MEDICATIONS:  Current Outpatient Medications  Medication Sig Dispense Refill   ASPIRIN LOW DOSE 81 MG EC tablet Take 81 mg by mouth daily.      cyclobenzaprine (FLEXERIL) 10 MG tablet Take 1 tablet (10 mg total) by mouth 3 (three) times daily as needed for muscle spasms. (Patient taking differently: Take 10 mg by mouth every 8 (eight) hours.) 30 tablet 0   enalapril (VASOTEC) 20 MG tablet Take 20 mg by mouth 2 (two) times daily.      hydrochlorothiazide (HYDRODIURIL) 25 MG tablet Take 25 mg by mouth daily.      metFORMIN (GLUCOPHAGE-XR) 500 MG 24 hr tablet Take 500 mg by mouth daily  with breakfast.      metoprolol succinate (TOPROL-XL) 50 MG 24 hr tablet Take 50 mg by mouth daily. Take with or immediately following a meal.     potassium chloride (KLOR-CON) 8 MEQ tablet Take 8 mEq by mouth daily.      atorvastatin (LIPITOR) 40 MG tablet Take 40 mg by mouth at bedtime.  (Patient not taking: Reported on 03/28/2021)     No current facility-administered medications for this visit.     PHYSICAL EXAMINATION: ECOG PERFORMANCE STATUS: 1 - Symptomatic but completely ambulatory Vitals:   03/28/21 1426  BP: 122/83  Pulse: (!) 59  Resp: 16  Temp: (!) 97.5 F (36.4 C)  SpO2: 99%   Filed Weights   03/28/21 1426  Weight: 181 lb (82.1 kg)    Physical Exam Constitutional:      General: He is not in acute distress. HENT:     Head: Normocephalic and atraumatic.  Eyes:     General: No scleral icterus.    Pupils: Pupils are equal, round, and reactive to light.  Cardiovascular:     Rate and Rhythm:  Normal rate and regular rhythm.     Heart sounds: Normal heart sounds.  Pulmonary:     Effort: Pulmonary effort is normal. No respiratory distress.     Breath sounds: No wheezing.  Abdominal:     General: Bowel sounds are normal. There is no distension.     Palpations: Abdomen is soft. There is no mass.     Tenderness: There is no abdominal tenderness.  Musculoskeletal:        General: No deformity. Normal range of motion.     Cervical back: Normal range of motion and neck supple.  Skin:    General: Skin is warm and dry.     Findings: No erythema or rash.  Neurological:     Mental Status: He is alert and oriented to person, place, and time.     Cranial Nerves: No cranial nerve deficit.     Coordination: Coordination normal.  Psychiatric:        Behavior: Behavior normal.        Thought Content: Thought content normal.    LABORATORY DATA:  I have reviewed the data as listed Lab Results  Component Value Date   WBC 3.3 (L) 03/10/2021   HGB 14.0 03/10/2021   HCT  40.3 03/10/2021   MCV 93.3 03/10/2021   PLT 156 03/10/2021   Recent Labs    09/12/20 1328 03/10/21 0811  NA 133* 136  K 3.8 3.3*  CL 102 95*  CO2 27 32  GLUCOSE 154* 148*  BUN 11 11  CREATININE 1.04 1.30*  CALCIUM 8.6* 9.2  GFRNONAA >60 58*  PROT 7.1 7.3  ALBUMIN 3.8 4.0  AST 28 31  ALT 26 32  ALKPHOS 56 64  BILITOT 0.7 0.7    Iron/TIBC/Ferritin/ %Sat    Component Value Date/Time   IRON 58 09/12/2020 1328   TIBC 294 09/12/2020 1328   FERRITIN 74 09/12/2020 1328   IRONPCTSAT 20 09/12/2020 1328     Baseline CEA has been checked on 04/23/2019 which was 3.7. RADIOGRAPHIC STUDIES: I have personally reviewed the radiological images as listed and agreed with the findings in the report. No results found.     ASSESSMENT & PLAN:  1. Malignant neoplasm of ascending colon ([place])   2. Family history of cancer   3. Hypokalemia   Cancer Staging Malignant neoplasm of colon ([place]) Staging form: Colon and Rectum, AJCC 8th Edition - Clinical: No stage assigned - Unsigned - Pathologic: Stage I (pT1, pN0, cM0) - Signed by Earlie Server, MD on 06/13/2019  #Stage I right colon cancer status post right hemicolectomy. Medically patient is doing very well. Labs reviewed and discussed with patient CEA is stable at 3.2. Follow-up in 1 year. #Mild hyperkalemia, patient has CKD.  Continue follow-up with primary care provider.  Increase potassium rich food. #Anemia has resolved.Marland Kitchen #Family history as well as personal history of colon cancer.  Previously referred patient to genetic counselor and he did not establish care.  Orders Placed This Encounter  Procedures   CBC with Differential/Platelet    Standing Status:   Future    Standing Expiration Date:   03/28/2022   Comprehensive metabolic panel    Standing Status:   Future    Standing Expiration Date:   03/28/2022   CEA    Standing Status:   Future    Standing Expiration Date:   03/28/2022    All questions were answered. The patient  knows to call the clinic with any problems questions or concerns.  Follow-up in 1 year  Earlie Server, MD, PhD Hematology Oncology [place] at [facility]  03/29/2021

## 2021-04-23 IMAGING — CT CT CHEST W/ CM
2 of 3 series · 14 of 31 positions shown, 17 images · IV contrast (omnipaque)
Comparison: No priors.

CLINICAL DATA: 72-year-old male with history of mass noted in the
proximal ascending colon on recent colonoscopy. Follow-up study.

EXAM:
CT CHEST, ABDOMEN, AND PELVIS WITH CONTRAST
TECHNIQUE: Multidetector CT imaging of the chest, abdomen and pelvis was
performed following the standard protocol during bolus
administration of intravenous contrast.
CONTRAST:  100mL OMNIPAQUE IOHEXOL 300 MG/ML  SOLN

[Series 2: cap with · axial · 0.77mm/px · z∈[-636,-156]mm · 10 of 122 slices shown, 13 images]
[im 13/122  mediastinal]
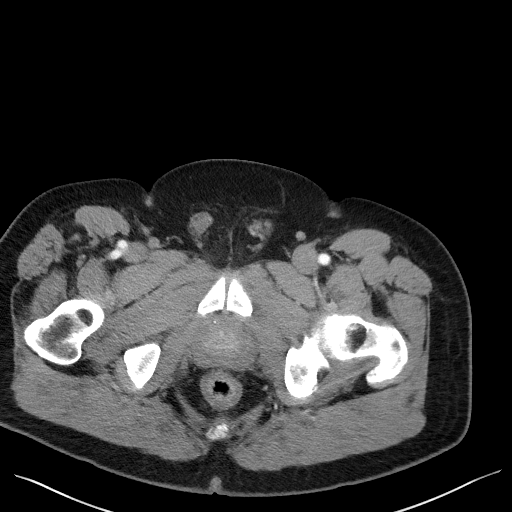
[im 13/122  lung]
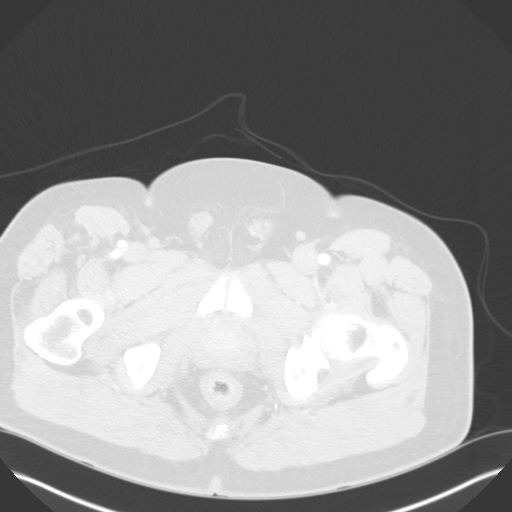
[im 25/122  lung]
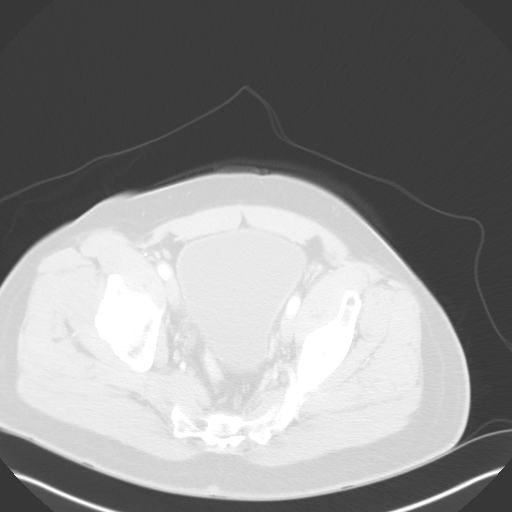
[im 37/122  lung]
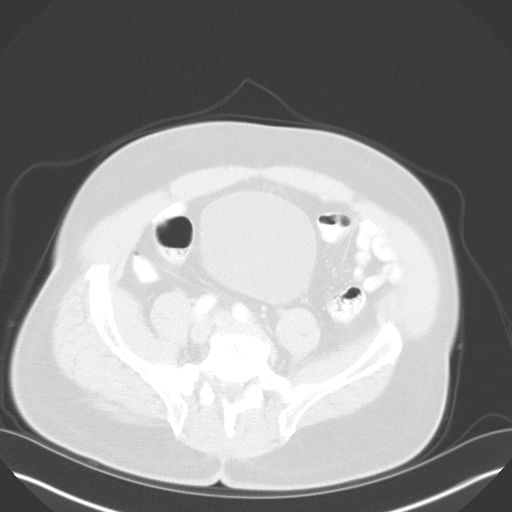
[im 49/122  lung]
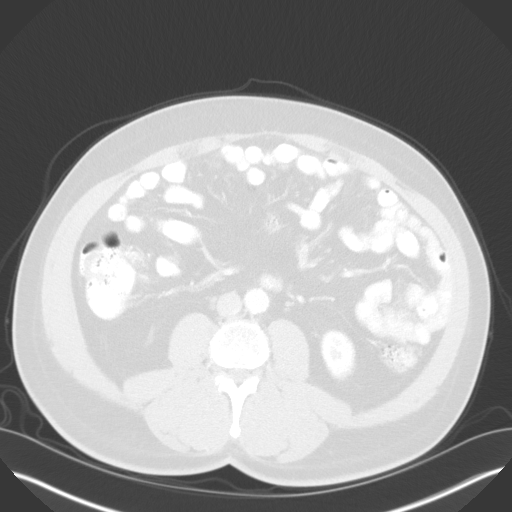
[im 60/122  mediastinal]
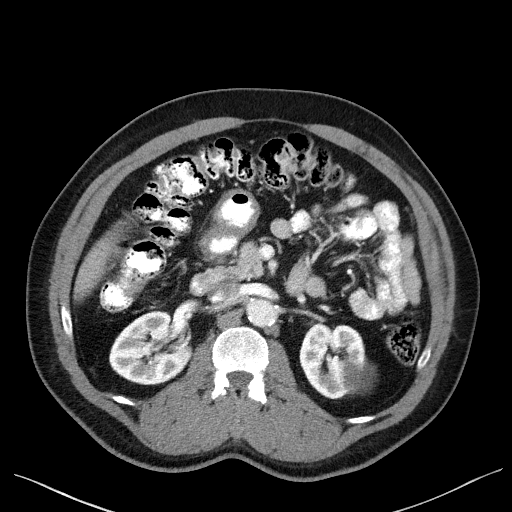
[im 60/122  lung]
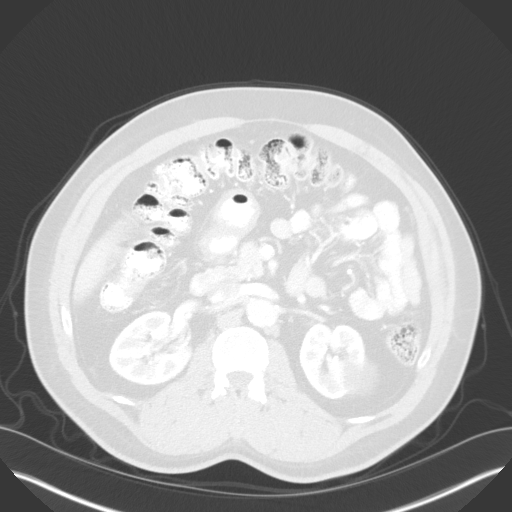
[im 61/122  lung]
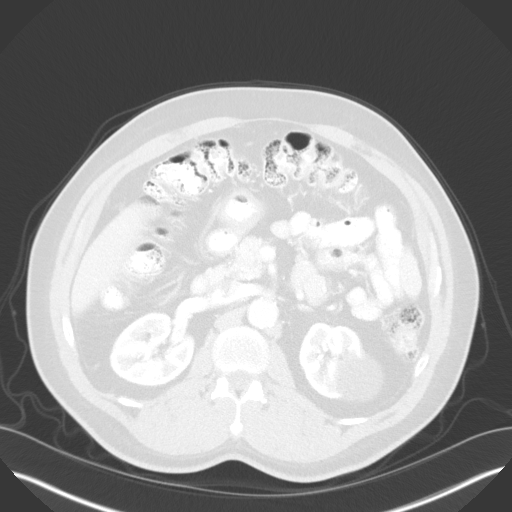
[im 73/122  lung]
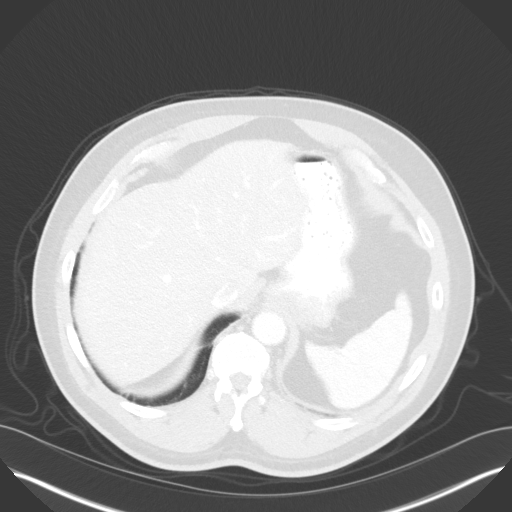
[im 85/122  lung]
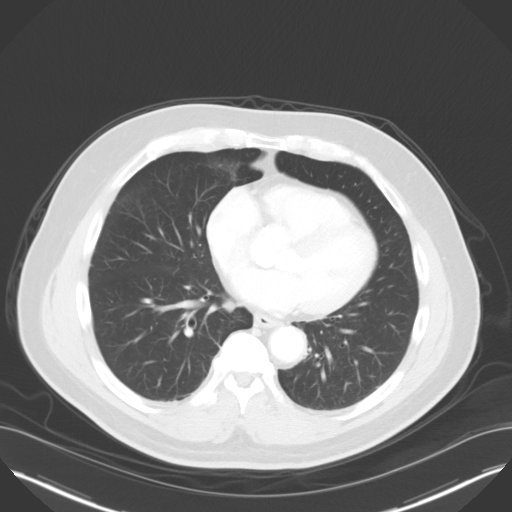
[im 97/122  mediastinal]
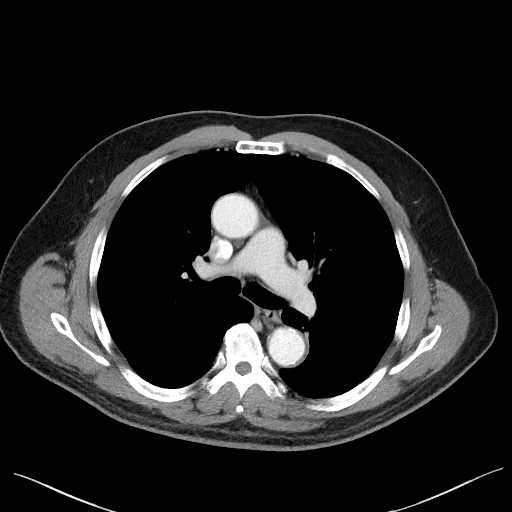
[im 97/122  lung]
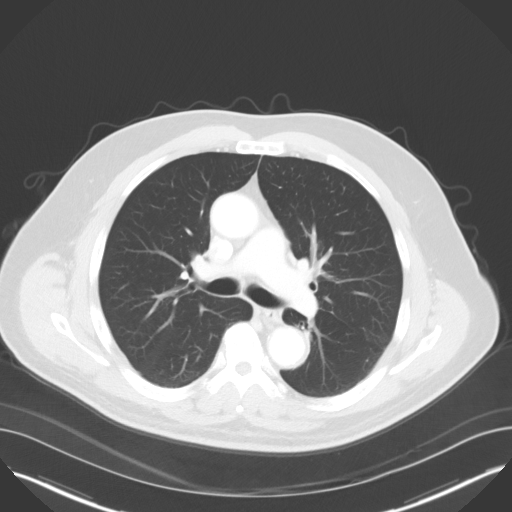
[im 109/122  lung]
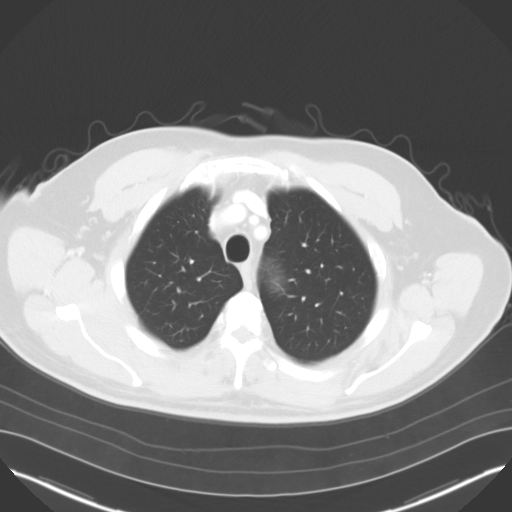

[Series 4: lung · axial · 0.77mm/px · z∈[-373,-255]mm · 4 of 153 slices shown]
[im 12/153  lung]
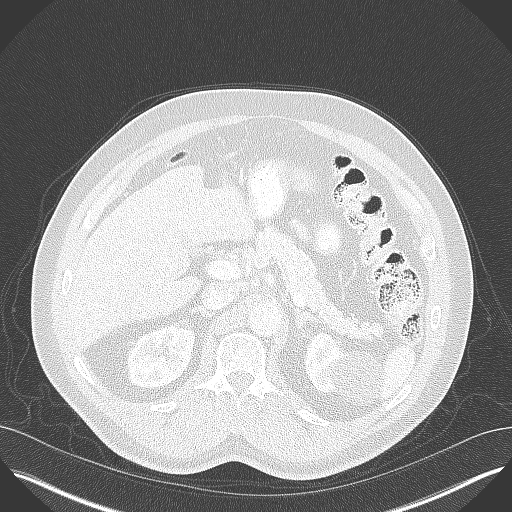
[im 36/153  lung]
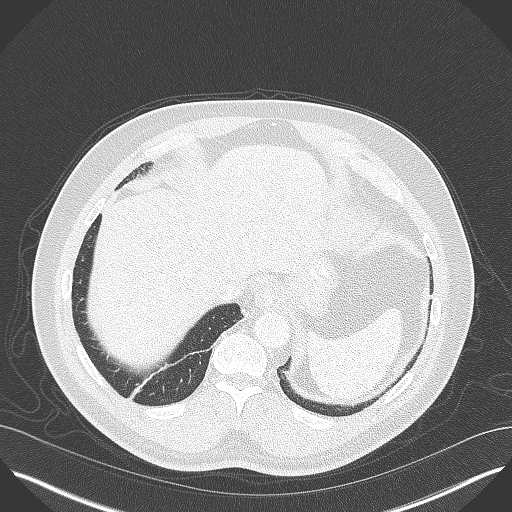
[im 59/153  lung]
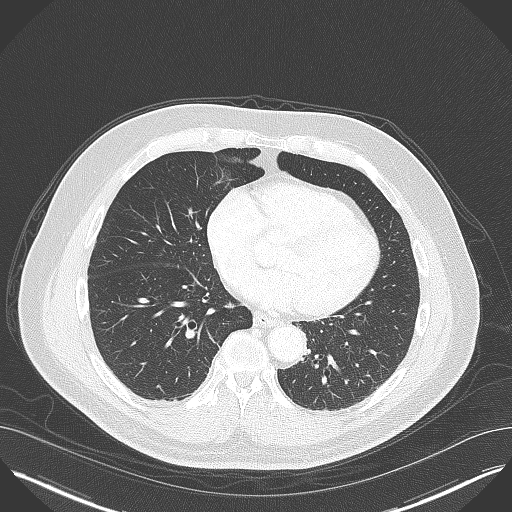
[im 71/153  lung]
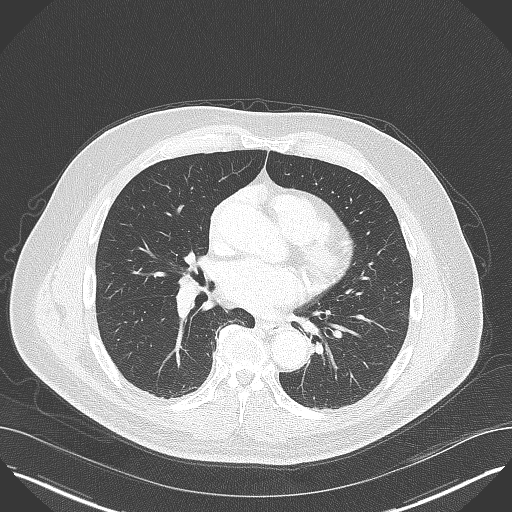

[14 of 31 positions shown; findings below may reference images not displayed]

FINDINGS: CT CHEST FINDINGS

Cardiovascular: Heart size is normal. There is no significant
pericardial fluid, thickening or pericardial calcification. There is
aortic atherosclerosis, as well as atherosclerosis of the great
vessels of the mediastinum and the coronary arteries, including
calcified atherosclerotic plaque in the left anterior descending and
right coronary arteries.

Mediastinum/Nodes: No pathologically enlarged mediastinal or hilar
lymph nodes. Esophagus is unremarkable in appearance. No axillary
lymphadenopathy.

Lungs/Pleura: No suspicious appearing pulmonary nodules or masses
are noted. No acute consolidative airspace disease. No pleural
effusions. Mild linear scarring in the lung bases bilaterally.

Musculoskeletal: There are no aggressive appearing lytic or blastic
lesions noted in the visualized portions of the skeleton.

CT ABDOMEN PELVIS FINDINGS

Hepatobiliary: No suspicious cystic or solid hepatic lesions. No
intra or extrahepatic biliary ductal dilatation. Gallbladder is
normal in appearance.

Pancreas: No pancreatic mass. No pancreatic ductal dilatation. No
pancreatic or peripancreatic fluid collections or inflammatory
changes.

Spleen: Unremarkable.

Adrenals/Urinary Tract: Right kidney and bilateral adrenal glands
are normal in appearance. In the upper pole the left kidney there is
an exophytic 4.6 cm low-attenuation lesion which does not enhance,
compatible with a simple cyst. No hydroureteronephrosis. Urinary
bladder is unremarkable in appearance.

Stomach/Bowel: Normal appearance of the stomach. No pathologic
dilatation of small bowel or colon. Circumferential thickening of
the distal rectum best appreciated on axial image 111 of series 2.
In addition, in the mid ascending colon (axial image 73 of series 2
and coronal image 84 of series 5) there is a 2.6 x 2.8 x 2.4 cm
circumferential mass which mildly narrows the colonic lumen. Normal
appendix.

Vascular/Lymphatic: Aortic atherosclerosis, without evidence of
aneurysm or dissection in the abdominal or pelvic vasculature. No
lymphadenopathy noted in the abdomen or pelvis.

Reproductive: Prostate gland and seminal vesicles are unremarkable
in appearance.

Other: No significant volume of ascites.  No pneumoperitoneum.

Musculoskeletal: There are no aggressive appearing lytic or blastic
lesions noted in the visualized portions of the skeleton.
IMPRESSION: 1. 2.6 x 2.8 x 2.4 cm circumferential mass in the mid ascending
colon, corresponding to the recently diagnosed colonic neoplasm.
2. Circumferential thickening of the distal rectum. Assuming no mass
was noted in this region on recent colonoscopy, this may simply
reflect a proctitis, but clinical correlation is recommended.
3. No signs of metastatic disease in the chest, abdomen or pelvis.
4. Aortic atherosclerosis, in addition to 2 vessel coronary artery
disease. Assessment for potential risk factor modification, dietary
therapy or pharmacologic therapy may be warranted, if clinically
indicated.
5. Additional incidental findings, as above.

## 2021-04-23 IMAGING — CT CT ABD-PELV W/ CM
1 of 3 series · 10 of 32 positions shown, 16 images · IV contrast (omnipaque)
Comparison: No priors.

CLINICAL DATA: 72-year-old male with history of mass noted in the
proximal ascending colon on recent colonoscopy. Follow-up study.

EXAM:
CT CHEST, ABDOMEN, AND PELVIS WITH CONTRAST
TECHNIQUE: Multidetector CT imaging of the chest, abdomen and pelvis was
performed following the standard protocol during bolus
administration of intravenous contrast.
CONTRAST:  100mL OMNIPAQUE IOHEXOL 300 MG/ML  SOLN

[Series 2: cap with · axial · 0.77mm/px · z∈[-641,-146]mm · 10 of 122 slices shown, 16 images]
[im 12/122  soft-tissue]
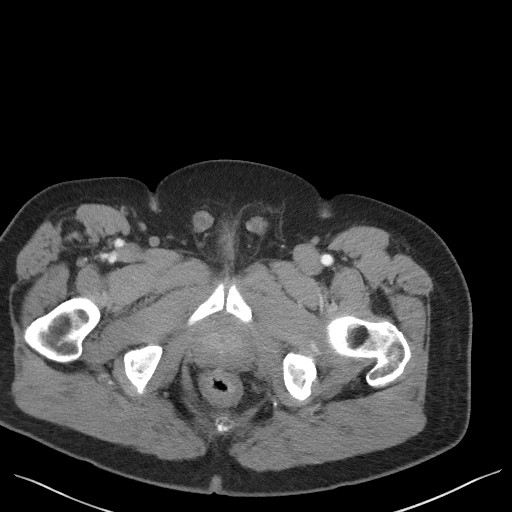
[im 12/122  bone]
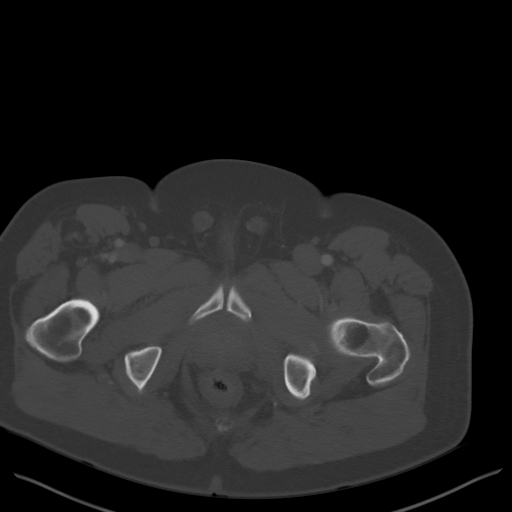
[im 23/122  soft-tissue]
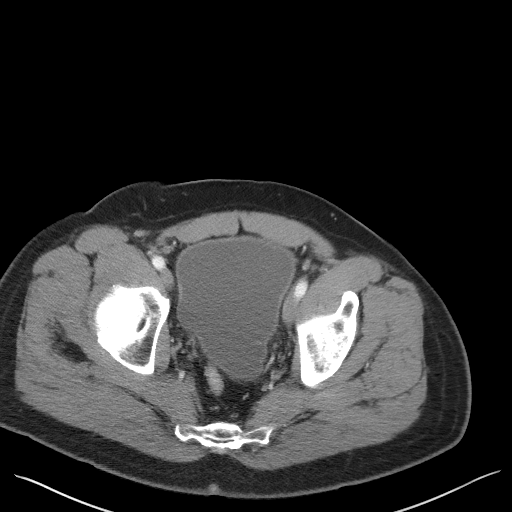
[im 34/122  soft-tissue]
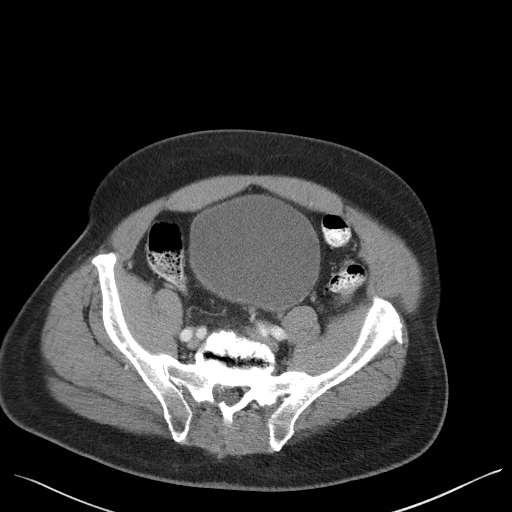
[im 45/122  soft-tissue]
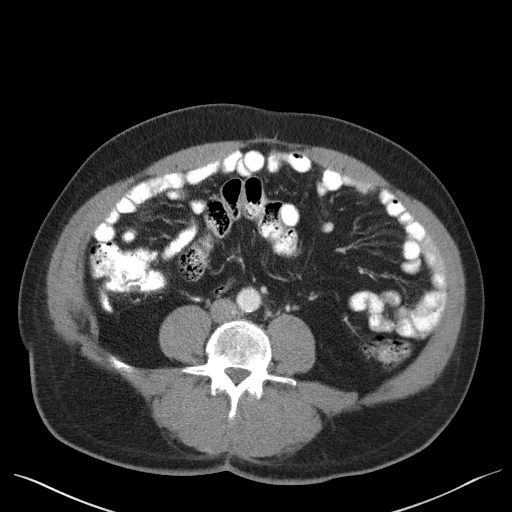
[im 56/122  soft-tissue]
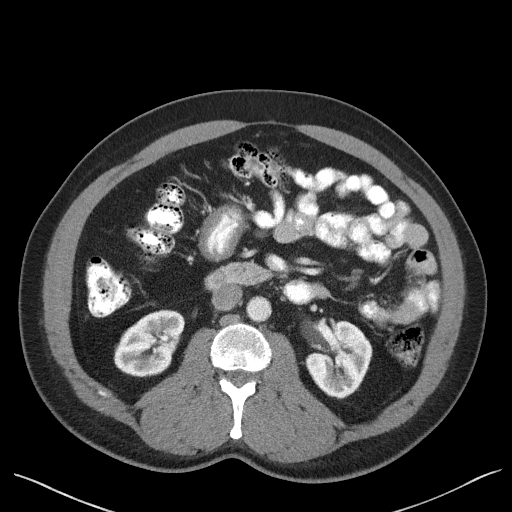
[im 67/122  soft-tissue]
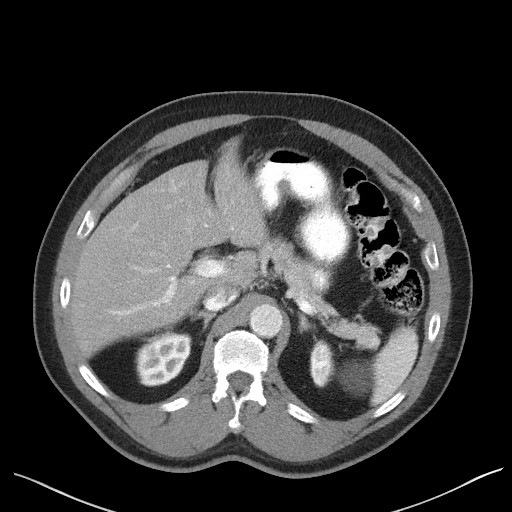
[im 78/122  soft-tissue]
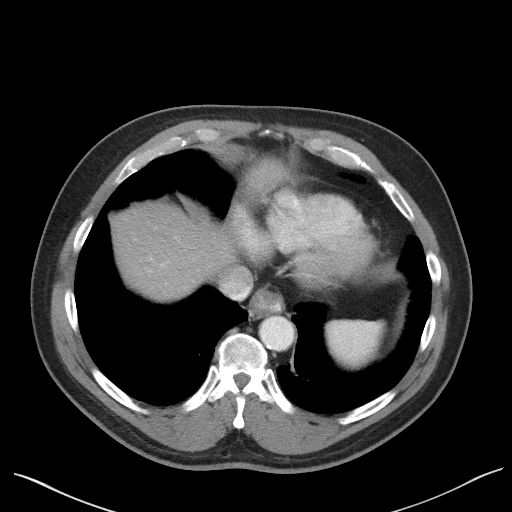
[im 78/122  lung]
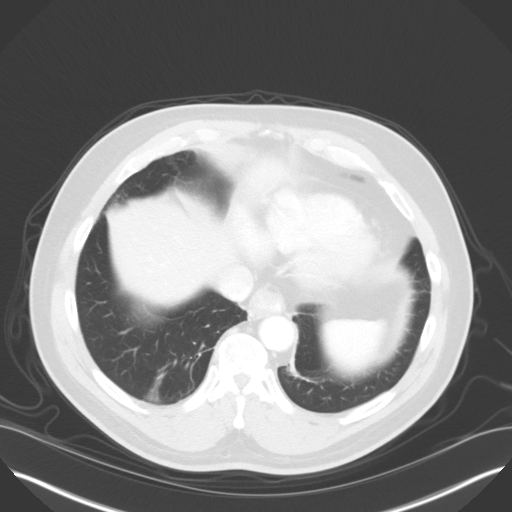
[im 89/122  soft-tissue]
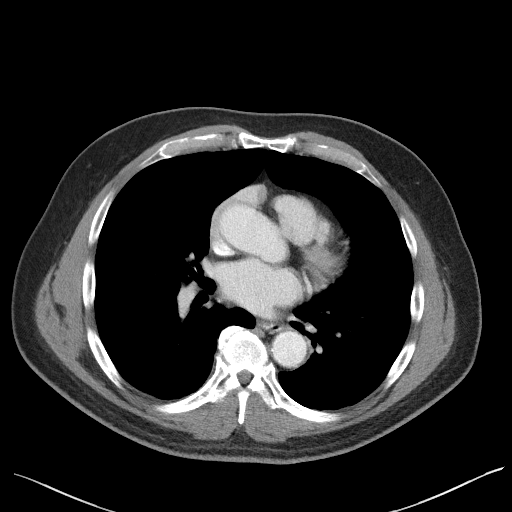
[im 89/122  lung]
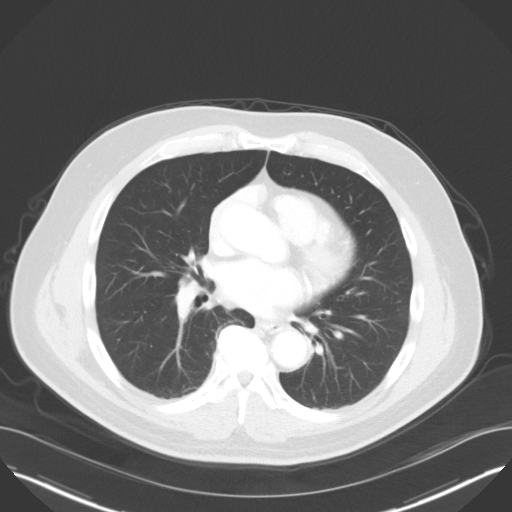
[im 100/122  soft-tissue]
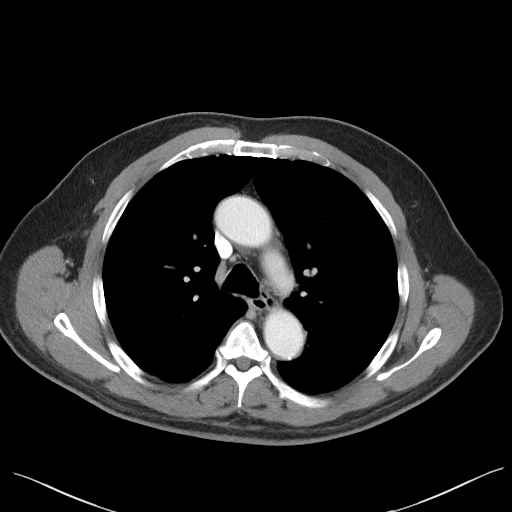
[im 100/122  lung]
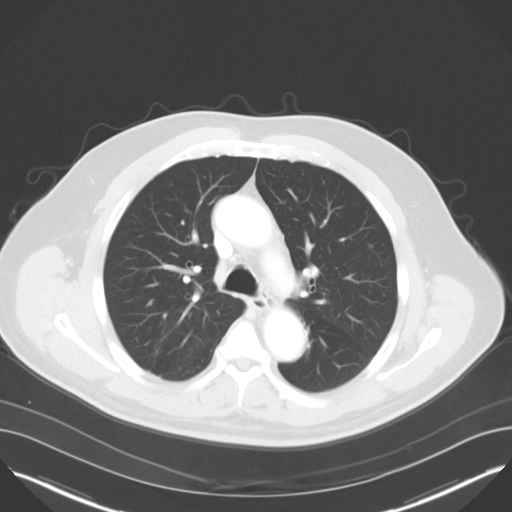
[im 100/122  bone]
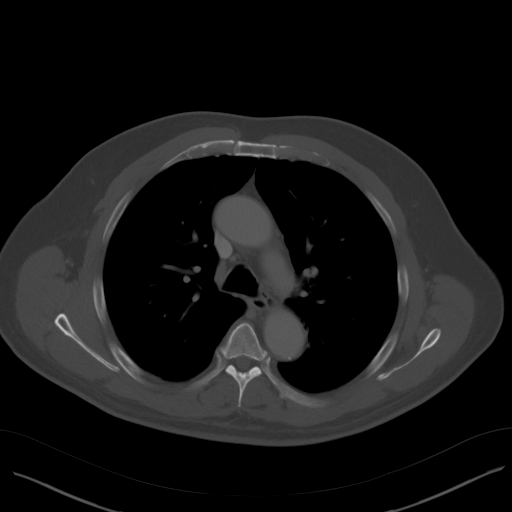
[im 111/122  soft-tissue]
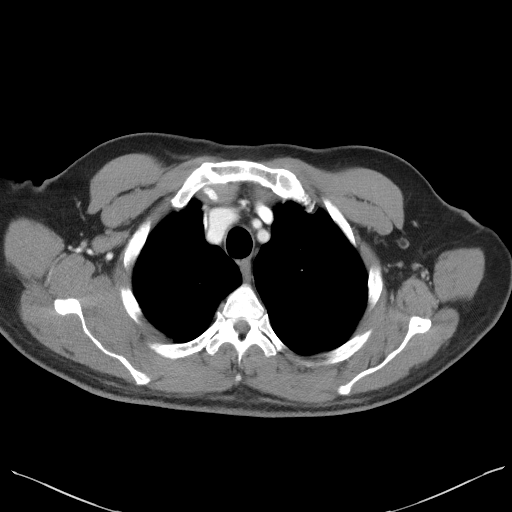
[im 111/122  lung]
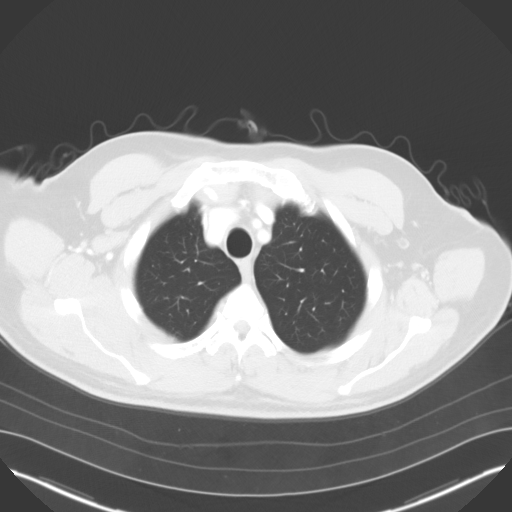

[10 of 32 positions shown; findings below may reference images not displayed]

FINDINGS: CT CHEST FINDINGS

Cardiovascular: Heart size is normal. There is no significant
pericardial fluid, thickening or pericardial calcification. There is
aortic atherosclerosis, as well as atherosclerosis of the great
vessels of the mediastinum and the coronary arteries, including
calcified atherosclerotic plaque in the left anterior descending and
right coronary arteries.

Mediastinum/Nodes: No pathologically enlarged mediastinal or hilar
lymph nodes. Esophagus is unremarkable in appearance. No axillary
lymphadenopathy.

Lungs/Pleura: No suspicious appearing pulmonary nodules or masses
are noted. No acute consolidative airspace disease. No pleural
effusions. Mild linear scarring in the lung bases bilaterally.

Musculoskeletal: There are no aggressive appearing lytic or blastic
lesions noted in the visualized portions of the skeleton.

CT ABDOMEN PELVIS FINDINGS

Hepatobiliary: No suspicious cystic or solid hepatic lesions. No
intra or extrahepatic biliary ductal dilatation. Gallbladder is
normal in appearance.

Pancreas: No pancreatic mass. No pancreatic ductal dilatation. No
pancreatic or peripancreatic fluid collections or inflammatory
changes.

Spleen: Unremarkable.

Adrenals/Urinary Tract: Right kidney and bilateral adrenal glands
are normal in appearance. In the upper pole the left kidney there is
an exophytic 4.6 cm low-attenuation lesion which does not enhance,
compatible with a simple cyst. No hydroureteronephrosis. Urinary
bladder is unremarkable in appearance.

Stomach/Bowel: Normal appearance of the stomach. No pathologic
dilatation of small bowel or colon. Circumferential thickening of
the distal rectum best appreciated on axial image 111 of series 2.
In addition, in the mid ascending colon (axial image 73 of series 2
and coronal image 84 of series 5) there is a 2.6 x 2.8 x 2.4 cm
circumferential mass which mildly narrows the colonic lumen. Normal
appendix.

Vascular/Lymphatic: Aortic atherosclerosis, without evidence of
aneurysm or dissection in the abdominal or pelvic vasculature. No
lymphadenopathy noted in the abdomen or pelvis.

Reproductive: Prostate gland and seminal vesicles are unremarkable
in appearance.

Other: No significant volume of ascites.  No pneumoperitoneum.

Musculoskeletal: There are no aggressive appearing lytic or blastic
lesions noted in the visualized portions of the skeleton.
IMPRESSION: 1. 2.6 x 2.8 x 2.4 cm circumferential mass in the mid ascending
colon, corresponding to the recently diagnosed colonic neoplasm.
2. Circumferential thickening of the distal rectum. Assuming no mass
was noted in this region on recent colonoscopy, this may simply
reflect a proctitis, but clinical correlation is recommended.
3. No signs of metastatic disease in the chest, abdomen or pelvis.
4. Aortic atherosclerosis, in addition to 2 vessel coronary artery
disease. Assessment for potential risk factor modification, dietary
therapy or pharmacologic therapy may be warranted, if clinically
indicated.
5. Additional incidental findings, as above.

## 2021-08-02 ENCOUNTER — Encounter: Payer: Self-pay | Admitting: Urology

## 2021-08-02 ENCOUNTER — Other Ambulatory Visit: Payer: Self-pay

## 2021-08-02 ENCOUNTER — Ambulatory Visit (INDEPENDENT_AMBULATORY_CARE_PROVIDER_SITE_OTHER): Payer: Medicaid Other | Admitting: Urology

## 2021-08-02 VITALS — BP 160/97 | HR 68 | Ht 64.0 in | Wt 180.0 lb

## 2021-08-02 DIAGNOSIS — N138 Other obstructive and reflux uropathy: Secondary | ICD-10-CM

## 2021-08-02 DIAGNOSIS — N5089 Other specified disorders of the male genital organs: Secondary | ICD-10-CM

## 2021-08-02 DIAGNOSIS — N401 Enlarged prostate with lower urinary tract symptoms: Secondary | ICD-10-CM

## 2021-08-02 DIAGNOSIS — N432 Other hydrocele: Secondary | ICD-10-CM

## 2021-08-02 NOTE — Patient Instructions (Signed)
Hydrocele, Adult A hydrocele is a collection of fluid in the loose pouch of skin that holds the testicles (scrotum). It can occur in one or both testicles. This may happen because: The amount of fluid produced in the scrotum is not absorbed by the rest of the body. Fluid from the abdomen fills the scrotum. Normally, the testicles develop in the abdomen and then drop into the scrotum before birth. The tube that the testicles travel through usually closes after the testicles drop. If the tube does not close, fluid from the abdomen can fill the scrotum. This is not very common in adults. What are the causes? A hydrocele may be caused by: An injury to the scrotum. An infection. Decreased blood flow to the scrotum. Twisting of a testicle (testicular torsion). A birth defect. A tumor or cancer of the testicle. Sometimes, the cause is not known. What are the signs or symptoms? A hydrocele feels like a water-filled balloon. It may also feel heavy. Other symptoms include: Swelling of the scrotum. The swelling may decrease when you lie down. You may also notice more swelling at night than in the morning. This is called a communicating hydrocele, in which the fluid in the scrotum goes back into the abdominal cavity when the position of the scrotum changes. Swelling of the groin. Mild discomfort in the scrotum. Pain. This can develop if the hydrocele was caused by infection or twisting. The larger the hydrocele, the more likely you are to have pain. Swelling may also cause pain. How is this diagnosed? This condition may be diagnosed based on a physical exam and your medical history. You may also have tests, including: Imaging tests, such as an ultrasound. A transillumination test. This test takes place in a dark room where a light is placed on the skin of the scrotum. Clear liquid will not impede the light and the scrotum will be illuminated. This helps a health care provider distinguish a hydrocele from a  tumor. Blood or urine tests. How is this treated? Most hydroceles go away on their own. If you have no discomfort or pain, your health care provider may suggest close monitoring of your condition until the condition goes away or symptoms develop. This is called watch and wait or watchful waiting. If treatment is needed, it may include: Treating an underlying condition. This may include taking an antibiotic medicine to treat an infection. Having surgery to stop fluid from collecting in the scrotum. Having surgery to drain the fluid. Surgery may include: Hydrocelectomy. For this procedure, an incision is made in the scrotum to remove the fluid. Needle aspiration. A needle is used to drain fluid. However, the fluid buildup will come back quickly and may lead to an infection of the scrotum. This is rarely done. Follow these instructions at home: Medicines Take over-the-counter and prescription medicines only as told by your health care provider. If you were prescribed an antibiotic medicine, take it as told by your health care provider. Do not stop taking the antibiotic even if you start to feel better. General instructions Watch the hydrocele for any changes. Keep all follow-up visits. This is important. Contact a health care provider if: You notice any changes in the hydrocele. The swelling in your scrotum or groin gets worse. The hydrocele becomes red, firm, painful, or tender to the touch. You have a fever. Get help right away if you: Develop a lot of pain or your pain becomes worse. Have chills. Have a high fever. Summary A hydrocele is  a collection of fluid in the loose pouch of skin that holds the testicles (scrotum). A hydrocele can cause swelling, discomfort, and pain. In adults, the cause of a hydrocele may not be known. However, it is sometimes caused by an infection or the twisting of a testicle. Hydroceles often go away on their own. If a hydrocele causes pain, treating the  underlying cause may be needed to ease the pain. This information is not intended to replace advice given to you by your health care provider. Make sure you discuss any questions you have with your health care provider. Document Revised: 03/16/2021 Document Reviewed: 03/16/2021 Elsevier Patient Education  [employer].

## 2021-08-02 NOTE — Progress Notes (Signed)
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  He has been voiding well since that time and PVRs have been normal, most recently 14 mL in June 2022.  He was sent over from PCP for new right scrotal swelling.  He reports a few weeks of some right-sided scrotal swelling that started after doing some heavy lifting.  This was initially tender, but has improved over the last few days.  He denies any fevers, chills, dysuria, or gross hematuria.  I reviewed the CT abdomen and pelvis with contrast from February 2022 that does show a small to moderate right-sided hydrocele, but no evidence of hernia.  On exam there is moderate right scrotal edema, most consistent with hydrocele, nontender.  I recommended a scrotal ultrasound for further evaluation, and will call with those results.  We discussed management options for hydrocele including observation or hydrocelectomy.  He seems to be minimally bothered at this time   [name], [name] [address], [place], [place] [address] [phone]

## 2021-08-11 ENCOUNTER — Encounter: Payer: Self-pay | Admitting: Urology

## 2021-10-25 ENCOUNTER — Encounter: Payer: Self-pay | Admitting: Urology

## 2021-10-25 ENCOUNTER — Ambulatory Visit (INDEPENDENT_AMBULATORY_CARE_PROVIDER_SITE_OTHER): Payer: Medicaid Other | Admitting: Urology

## 2021-10-25 ENCOUNTER — Other Ambulatory Visit: Payer: Self-pay

## 2021-10-25 VITALS — BP 136/97 | HR 109 | Ht 64.0 in | Wt 170.3 lb

## 2021-10-25 DIAGNOSIS — N138 Other obstructive and reflux uropathy: Secondary | ICD-10-CM

## 2021-10-25 DIAGNOSIS — N433 Hydrocele, unspecified: Secondary | ICD-10-CM

## 2021-10-25 DIAGNOSIS — N401 Enlarged prostate with lower urinary tract symptoms: Secondary | ICD-10-CM

## 2021-10-25 LAB — BLADDER SCAN AMB NON-IMAGING

## 2021-10-25 NOTE — Progress Notes (Signed)
? ?  10/25/2021 ?9:16 AM  ? ?Dalton Villarreal ?April 25, 1946 ?[id number] ? ?Reason for visit: Follow up BPH status post HOLEP, right-sided scrotal swelling ? ?HPI: ?76 year old male with history of BPH and elevated PVRs greater than 500 mL and overflow incontinence who ultimately underwent a HoLEP in April 2022.  He has been voiding well since that time and initial postop PVR in 3 months was 14 mL.  He has some mild postvoid dribbling, but no significant incontinence.  PVR today mildly elevated at 214 mL, and with his likely longstanding obstruction prior to undergoing surgery I recommended timed voiding every 2-3 hours during the day as he likely has a component of decreased bladder sensation and atonic bladder. ? ?He also was referred in December 2022 for right-sided scrotal swelling that was minimally bothersome, and was most consistent with a hydrocele.  Scrotal ultrasound was ordered but never completed.  On exam today he has some mild right-sided scrotal swelling most consistent with hydrocele, nontender.  We discussed options again including observation or hydrocelectomy, and he is satisfied with observation at this time as he is minimally bothered.  Return precautions were discussed. ? ?RTC 1 year PVR, and hydrocele check ? ? ?[name], MD ? ?[facility] ?[address] ?[place], [place] [address] ?[phone] ? ?

## 2021-10-25 NOTE — Patient Instructions (Signed)
Try to empty your bladder every 2-3 hours during the day.  You had a longstanding bladder blockage prior to surgery, and this likely cause some decrease sensation in your bladder nerves. ? ?You also have a small hydrocele in the right scrotum, and this can be watched safely and does not require surgery. ? ? ?

## 2021-10-26 ENCOUNTER — Ambulatory Visit: Payer: Self-pay | Admitting: Urology

## 2022-03-26 ENCOUNTER — Inpatient Hospital Stay: Payer: Medicaid Other | Attending: Oncology

## 2022-03-26 DIAGNOSIS — Z803 Family history of malignant neoplasm of breast: Secondary | ICD-10-CM | POA: Diagnosis not present

## 2022-03-26 DIAGNOSIS — E876 Hypokalemia: Secondary | ICD-10-CM | POA: Insufficient documentation

## 2022-03-26 DIAGNOSIS — D649 Anemia, unspecified: Secondary | ICD-10-CM | POA: Insufficient documentation

## 2022-03-26 DIAGNOSIS — Z8042 Family history of malignant neoplasm of prostate: Secondary | ICD-10-CM | POA: Insufficient documentation

## 2022-03-26 DIAGNOSIS — I2510 Atherosclerotic heart disease of native coronary artery without angina pectoris: Secondary | ICD-10-CM | POA: Insufficient documentation

## 2022-03-26 DIAGNOSIS — Z79899 Other long term (current) drug therapy: Secondary | ICD-10-CM | POA: Insufficient documentation

## 2022-03-26 DIAGNOSIS — C182 Malignant neoplasm of ascending colon: Secondary | ICD-10-CM | POA: Diagnosis present

## 2022-03-26 DIAGNOSIS — Z9049 Acquired absence of other specified parts of digestive tract: Secondary | ICD-10-CM | POA: Diagnosis not present

## 2022-03-26 LAB — CBC WITH DIFFERENTIAL/PLATELET
Abs Immature Granulocytes: 0.03 10*3/uL (ref 0.00–0.07)
Basophils Absolute: 0 10*3/uL (ref 0.0–0.1)
Basophils Relative: 1 %
Eosinophils Absolute: 0.2 10*3/uL (ref 0.0–0.5)
Eosinophils Relative: 4 %
HCT: 34.7 % — ABNORMAL LOW (ref 39.0–52.0)
Hemoglobin: 12.1 g/dL — ABNORMAL LOW (ref 13.0–17.0)
Immature Granulocytes: 1 %
Lymphocytes Relative: 42 %
Lymphs Abs: 2 10*3/uL (ref 0.7–4.0)
MCH: 32.6 pg (ref 26.0–34.0)
MCHC: 34.9 g/dL (ref 30.0–36.0)
MCV: 93.5 fL (ref 80.0–100.0)
Monocytes Absolute: 0.5 10*3/uL (ref 0.1–1.0)
Monocytes Relative: 10 %
Neutro Abs: 2 10*3/uL (ref 1.7–7.7)
Neutrophils Relative %: 42 %
Platelets: 156 10*3/uL (ref 150–400)
RBC: 3.71 MIL/uL — ABNORMAL LOW (ref 4.22–5.81)
RDW: 13.5 % (ref 11.5–15.5)
WBC: 4.7 10*3/uL (ref 4.0–10.5)
nRBC: 0 % (ref 0.0–0.2)

## 2022-03-26 LAB — COMPREHENSIVE METABOLIC PANEL
ALT: 24 U/L (ref 0–44)
AST: 26 U/L (ref 15–41)
Albumin: 3.5 g/dL (ref 3.5–5.0)
Alkaline Phosphatase: 58 U/L (ref 38–126)
Anion gap: 10 (ref 5–15)
BUN: 14 mg/dL (ref 8–23)
CO2: 27 mmol/L (ref 22–32)
Calcium: 8.3 mg/dL — ABNORMAL LOW (ref 8.9–10.3)
Chloride: 99 mmol/L (ref 98–111)
Creatinine, Ser: 1.24 mg/dL (ref 0.61–1.24)
GFR, Estimated: >60 mL/min (ref >60)
Glucose, Bld: 165 mg/dL — ABNORMAL HIGH (ref 70–99)
Potassium: 2.8 mmol/L — ABNORMAL LOW (ref 3.5–5.1)
Sodium: 136 mmol/L (ref 135–145)
Total Bilirubin: 0.4 mg/dL (ref 0.3–1.2)
Total Protein: 6.5 g/dL (ref 6.5–8.1)

## 2022-03-27 LAB — CEA: CEA: 2.6 ng/mL (ref 0.0–4.7)

## 2022-03-28 ENCOUNTER — Inpatient Hospital Stay: Payer: Medicaid Other

## 2022-03-28 ENCOUNTER — Encounter: Payer: Self-pay | Admitting: Oncology

## 2022-03-28 ENCOUNTER — Inpatient Hospital Stay (HOSPITAL_BASED_OUTPATIENT_CLINIC_OR_DEPARTMENT_OTHER): Payer: Medicaid Other | Admitting: Oncology

## 2022-03-28 VITALS — BP 130/82 | HR 59 | Temp 97.5°F | Resp 18 | Wt 173.8 lb

## 2022-03-28 DIAGNOSIS — Z809 Family history of malignant neoplasm, unspecified: Secondary | ICD-10-CM | POA: Diagnosis not present

## 2022-03-28 DIAGNOSIS — E876 Hypokalemia: Secondary | ICD-10-CM | POA: Diagnosis not present

## 2022-03-28 DIAGNOSIS — D649 Anemia, unspecified: Secondary | ICD-10-CM

## 2022-03-28 DIAGNOSIS — C182 Malignant neoplasm of ascending colon: Secondary | ICD-10-CM

## 2022-03-28 LAB — BASIC METABOLIC PANEL
Anion gap: 7 (ref 5–15)
BUN: 15 mg/dL (ref 8–23)
CO2: 28 mmol/L (ref 22–32)
Calcium: 8.7 mg/dL — ABNORMAL LOW (ref 8.9–10.3)
Chloride: 103 mmol/L (ref 98–111)
Creatinine, Ser: 1.11 mg/dL (ref 0.61–1.24)
GFR, Estimated: >60 mL/min (ref >60)
Glucose, Bld: 157 mg/dL — ABNORMAL HIGH (ref 70–99)
Potassium: 3 mmol/L — ABNORMAL LOW (ref 3.5–5.1)
Sodium: 138 mmol/L (ref 135–145)

## 2022-03-28 NOTE — Progress Notes (Unsigned)
Pt here for follow up. No new concerns voiced.   

## 2022-03-29 NOTE — Progress Notes (Signed)
Hematology/Oncology  Follow up note [facility] Telephone:(336) [phone] Fax:(336) [phone]   Patient Care Team: Donnie Coffin, MD as PCP - General (Family Medicine) Clent Jacks, RN as Oncology Nurse Navigator  REFERRING PROVIDER: Donnie Coffin, MD  CHIEF COMPLAINTS/REASON FOR VISIT:  Follow up for colon cancer  HISTORY OF PRESENTING ILLNESS:   Dalton Villarreal is a  76 y.o.  male with PMH listed below was seen in consultation at the request of  Donnie Coffin, MD  for evaluation of colon cancer Patient recently had a routine screening colonoscopy on 04/21/2019. 04/21/2007 colonoscopy showed a fungating nonobstructive large mass was found in the proximal ascending colon.  The mass was not circumferential.  Mass was biopsied. 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 Patient reports he is feeling well at baseline.  Denies any fever, chills, unintentional weight loss, change of bowel habit, blood in the stool, or abdominal pain.  Family history positive for brother with prostate cancer, mother passed away from breast cancer.  #Family history of cancer, recommend genetic testing.  Patient wants to defer.  #Radiographic evidence of CAD on CT scanning.  Discussed with patient.  Lifestyle modification discussed.  Recommend patient to continue follow-up with primary care physician for further management.  # 05/26/2019 patient underwent right hemicolectomy. Pathology positive for invasive adenocarcinoma.  21 lymph node harvested.  Lymphovascular invasion present.  Perineural invasion not identified.  Tumor deposits not identified.  All margins are negative.  pT1 pN0  # 1 year post surgery colonoscopy -05/06/2020, patient has had colonoscopy done by Dr. Vicente Males.  Healthy-appearing mucosa at the end to end ileocolonic anastomosis.  Examination was otherwise normal.  No specimens were collected.  INTERVAL HISTORY Dalton Villarreal is a 76 y.o.  male who has above history reviewed by me today presents for follow up visit for management of colon cancer Patient reports feeling well today.  Patient reports having good appetite.  Weight has been stable. He denies any nausea vomiting diarrhea abdominal pain.  No blood in the stool. Hypokalemia, he is on potassium supplementation.  Review of Systems  Constitutional:  Negative for appetite change, chills, fatigue, fever and unexpected weight change.  HENT:   Negative for hearing loss and voice change.   Eyes:  Negative for eye problems and icterus.  Respiratory:  Negative for chest tightness, cough and shortness of breath.   Cardiovascular:  Negative for chest pain and leg swelling.  Gastrointestinal:  Negative for abdominal distention and abdominal pain.  Endocrine: Negative for hot flashes.  Genitourinary:  Negative for difficulty urinating, dysuria and frequency.   Musculoskeletal:  Negative for arthralgias.  Skin:  Negative for itching and rash.  Neurological:  Negative for light-headedness and numbness.  Hematological:  Negative for adenopathy. Does not bruise/bleed easily.  Psychiatric/Behavioral:  Negative for confusion.     MEDICAL HISTORY:  Past Medical History:  Diagnosis Date   Cancer [facility])    colon cancer   Diabetes mellitus without complication ([place])    Hypercholesteremia    Hypertension    Ruptured lumbar disc     SURGICAL HISTORY: Past Surgical History:  Procedure Laterality Date   COLON SURGERY     COLONOSCOPY     COLONOSCOPY WITH PROPOFOL N/A 04/21/2019   Procedure: COLONOSCOPY WITH PROPOFOL;  Surgeon: [name], MD;  Location: [facility] ENDOSCOPY;  Service: Gastroenterology;  Laterality: N/A;   COLONOSCOPY WITH PROPOFOL N/A 05/06/2020   Procedure: COLONOSCOPY WITH PROPOFOL;  Surgeon: [name], MD;  Location: [facility] ENDOSCOPY;  Service: Gastroenterology;  Laterality: N/A;   EYE SURGERY Bilateral    cataracts bilaterally   HOLEP-LASER ENUCLEATION OF THE  PROSTATE WITH MORCELLATION N/A 11/18/2020   Procedure: HOLEP-LASER ENUCLEATION OF THE PROSTATE WITH MORCELLATION;  Surgeon: [name], MD;  Location: [facility] ORS;  Service: Urology;  Laterality: N/A;   LAPAROSCOPIC RIGHT COLECTOMY N/A 05/26/2019   Procedure: LAPAROSCOPIC RIGHT COLECTOMY;  Surgeon: Jules Husbands, MD;  Location: [facility] ORS;  Service: General;  Laterality: N/A;   LUMBAR LAMINECTOMY     UPPER GI ENDOSCOPY      SOCIAL HISTORY: Social History   Socioeconomic History   Marital status: Legally Separated    Spouse name: Not on file   Number of children: Not on file   Years of education: Not on file   Highest education level: Not on file  Occupational History   Not on file  Tobacco Use   Smoking status: Never   Smokeless tobacco: Never  Vaping Use   Vaping Use: Never used  Substance and Sexual Activity   Alcohol use: Not Currently   Drug use: Never   Sexual activity: Yes    Birth control/protection: None  Other Topics Concern   Not on file  Social History Narrative   Not on file   Social Determinants of Health   Financial Resource Strain: Not on file  Food Insecurity: Not on file  Transportation Needs: Not on file  Physical Activity: Not on file  Stress: Not on file  Social Connections: Not on file  Intimate Partner Violence: Not on file    FAMILY HISTORY: Family History  Problem Relation Age of Onset   Breast cancer Mother    Prostate cancer Brother     ALLERGIES:  has No Known Allergies.  MEDICATIONS:  Current Outpatient Medications  Medication Sig Dispense Refill   ASPIRIN LOW DOSE 81 MG EC tablet Take 81 mg by mouth daily.      atorvastatin (LIPITOR) 40 MG tablet Take by mouth.     enalapril (VASOTEC) 20 MG tablet Take 20 mg by mouth 2 (two) times daily.      hydrochlorothiazide (HYDRODIURIL) 25 MG tablet Take 25 mg by mouth daily.      metFORMIN (GLUCOPHAGE-XR) 500 MG 24 hr tablet Take 500 mg by mouth daily with breakfast.      metoprolol  succinate (TOPROL-XL) 50 MG 24 hr tablet Take 50 mg by mouth daily. Take with or immediately following a meal.     omeprazole (PRILOSEC) 20 MG capsule Take 20 mg by mouth daily at 2 PM.     potassium chloride (KLOR-CON) 8 MEQ tablet Take 8 mEq by mouth daily.      No current facility-administered medications for this visit.     PHYSICAL EXAMINATION: ECOG PERFORMANCE STATUS: 1 - Symptomatic but completely ambulatory Vitals:   03/28/22 1324  BP: 130/82  Pulse: (!) 59  Resp: 18  Temp: (!) 97.5 F (36.4 C)   Filed Weights   03/28/22 1324  Weight: 173 lb 12.8 oz (78.8 kg)    Physical Exam Constitutional:      General: He is not in acute distress. HENT:     Head: Normocephalic and atraumatic.  Eyes:     General: No scleral icterus.    Pupils: Pupils are equal, round, and reactive to light.  Cardiovascular:     Rate and Rhythm: Normal rate and regular rhythm.     Heart sounds: Normal heart sounds.  Pulmonary:  Effort: Pulmonary effort is normal. No respiratory distress.     Breath sounds: No wheezing.  Abdominal:     General: There is no distension.     Palpations: Abdomen is soft.  Musculoskeletal:        General: No deformity. Normal range of motion.     Cervical back: Normal range of motion and neck supple.  Skin:    General: Skin is warm and dry.     Findings: No erythema or rash.  Neurological:     Mental Status: He is alert and oriented to person, place, and time.     Cranial Nerves: No cranial nerve deficit.     Coordination: Coordination normal.  Psychiatric:        Mood and Affect: Mood normal.     LABORATORY DATA:  I have reviewed the data as listed    Latest Ref Rng & Units 03/26/2022   10:51 AM 03/10/2021    8:11 AM 09/12/2020    1:28 PM  CBC  WBC 4.0 - 10.5 K/uL 4.7  3.3  3.7   Hemoglobin 13.0 - 17.0 g/dL 12.1  14.0  12.5   Hematocrit 39.0 - 52.0 % 34.7  40.3  36.5   Platelets 150 - 400 K/uL 156  156  151       Latest Ref Rng & Units  03/28/2022    2:15 PM 03/26/2022   10:51 AM 03/10/2021    8:11 AM  CMP  Glucose 70 - 99 mg/dL 157  165  148   BUN 8 - 23 mg/dL '15  14  11   '$ Creatinine 0.61 - 1.24 mg/dL 1.11  1.24  1.30   Sodium 135 - 145 mmol/L 138  136  136   Potassium 3.5 - 5.1 mmol/L 3.0  2.8  3.3   Chloride 98 - 111 mmol/L 103  99  95   CO2 22 - 32 mmol/L 28  27  32   Calcium 8.9 - 10.3 mg/dL 8.7  8.3  9.2   Total Protein 6.5 - 8.1 g/dL  6.5  7.3   Total Bilirubin 0.3 - 1.2 mg/dL  0.4  0.7   Alkaline Phos 38 - 126 U/L  58  64   AST 15 - 41 U/L  26  31   ALT 0 - 44 U/L  24  32      Iron/TIBC/Ferritin/ %Sat    Component Value Date/Time   IRON 58 09/12/2020 1328   TIBC 294 09/12/2020 1328   FERRITIN 74 09/12/2020 1328   IRONPCTSAT 20 09/12/2020 1328     Baseline CEA has been checked on 04/23/2019 which was 3.7. RADIOGRAPHIC STUDIES: I have personally reviewed the radiological images as listed and agreed with the findings in the report. No results found.     ASSESSMENT & PLAN:  1. Hypokalemia   2. Family history of cancer   3. Malignant neoplasm of ascending colon ([place])   4. 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 Clinically patient is doing very well. Labs are reviewed and discussed with patient. CEA is normal and is stable. Patient will need to repeat colonoscopy in September 2024.  #Mild anemia, will add iron panel. #Hypokalemia, potassium 2 days ago  was 2.8, repeat labs today showed improvement to 3.  He is on potassium supplementation.  Patient has CKD.  I will forward results to primary care provider for further management.Marland Kitchen  #Family history as well as personal history of colon cancer.  Previously referred patient to genetic counselor  and he did not establish care.  Orders Placed This Encounter  Procedures   Basic metabolic panel    Standing Status:   Future    Number of Occurrences:   1    Standing Expiration Date:   03/28/2023    All questions were answered. The patient knows to call the clinic with any problems questions or concerns. Follow-up in 1 year  Earlie Server, MD, PhD Hematology Oncology  03/29/2022

## 2022-03-30 ENCOUNTER — Telehealth: Payer: Self-pay

## 2022-03-30 NOTE — Telephone Encounter (Signed)
-----   Message from Earlie Server, MD sent at 03/29/2022 12:25 AM EDT ----- Potassium has improved to 3.  He is already on chronic potassium supplementation.  Please forward this BMP as well as CMP 2 days ago to [facility] office

## 2022-03-30 NOTE — Telephone Encounter (Signed)
BMP/ CMP results faxed to Dr. Phill Mutter office

## 2022-10-25 ENCOUNTER — Ambulatory Visit: Payer: Medicaid Other | Admitting: Urology

## 2022-12-19 ENCOUNTER — Ambulatory Visit: Payer: Medicaid Other | Admitting: Urology

## 2023-01-30 ENCOUNTER — Ambulatory Visit (INDEPENDENT_AMBULATORY_CARE_PROVIDER_SITE_OTHER): Payer: Self-pay | Admitting: Urology

## 2023-01-30 ENCOUNTER — Encounter: Payer: Self-pay | Admitting: Urology

## 2023-01-30 VITALS — BP 173/109 | Ht 64.0 in | Wt 166.6 lb

## 2023-01-30 DIAGNOSIS — Z87438 Personal history of other diseases of male genital organs: Secondary | ICD-10-CM

## 2023-01-30 DIAGNOSIS — N432 Other hydrocele: Secondary | ICD-10-CM

## 2023-01-30 DIAGNOSIS — N401 Enlarged prostate with lower urinary tract symptoms: Secondary | ICD-10-CM

## 2023-01-30 DIAGNOSIS — Z09 Encounter for follow-up examination after completed treatment for conditions other than malignant neoplasm: Secondary | ICD-10-CM

## 2023-01-30 NOTE — Progress Notes (Signed)
   01/30/2023 1:01 PM   Dalton Villarreal June 05, 1946 [id number]  Reason for visit: Follow up BPH status post HOLEP, hydrocele  HPI: 77 year old male with history of BPH and elevated PVRs greater than 500 mL and overflow incontinence who ultimately underwent a HoLEP in April 2022.  He has been voiding well since that time and initial postop PVR in 3 months was 14 mL.  He has some mild postvoid dribbling, but no significant incontinence.  PVR today normal at 30ml.  He also has a known stable, small right-sided hydrocele.  This remains not bothersome.  On exam very small hydrocele, nontender, no suspicious lesions.  He would like to continue observation alone.  At this point can follow-up with urology as needed, return precautions were discussed including gross hematuria, UTIs, overflow incontinence, or worsening urinary symptoms   Sondra Come, MD  [facility] [address] [place], [place] [address] [phone]

## 2023-02-18 ENCOUNTER — Other Ambulatory Visit: Payer: Self-pay

## 2023-02-18 ENCOUNTER — Telehealth: Payer: Self-pay | Admitting: Gastroenterology

## 2023-02-18 ENCOUNTER — Telehealth: Payer: Self-pay

## 2023-02-18 DIAGNOSIS — Z85038 Personal history of other malignant neoplasm of large intestine: Secondary | ICD-10-CM

## 2023-02-18 DIAGNOSIS — Z800 Family history of malignant neoplasm of digestive organs: Secondary | ICD-10-CM

## 2023-02-18 DIAGNOSIS — Z809 Family history of malignant neoplasm, unspecified: Secondary | ICD-10-CM

## 2023-02-18 MED ORDER — PEG 3350-KCL-NA BICARB-NACL 420 G PO SOLR: 4000.0000 mL | Freq: Once | ORAL | 0 refills | Status: AC

## 2023-02-18 NOTE — Telephone Encounter (Signed)
Pt left message received letter to schedule colonoscopy please return call

## 2023-02-18 NOTE — Telephone Encounter (Signed)
Gastroenterology Pre-Procedure Review  Request Date: 05/08/23 Requesting Physician: Dr. Tobi Bastos  PATIENT REVIEW QUESTIONS: The patient responded to the following health history questions as indicated:    1. Are you having any GI issues? no 2. Do you have a personal history of Polyps? no 3. Do you have a family history of Colon Cancer or Polyps? yes (brother colon cancer) 4. Diabetes Mellitus? yes (metformin has been advised ) 5. Joint replacements in the past 12 months?no 6. Major health problems in the past 3 months?no 7. Any artificial heart valves, MVP, or defibrillator?no    MEDICATIONS & ALLERGIES:    Patient reports the following regarding taking any anticoagulation/antiplatelet therapy:   Plavix, Coumadin, Eliquis, Xarelto, Lovenox, Pradaxa, Brilinta, or Effient? no Aspirin? Yes 81mg  daily  Patient confirms/reports the following medications:  Current Outpatient Medications  Medication Sig Dispense Refill   ASPIRIN LOW DOSE 81 MG EC tablet Take 81 mg by mouth daily.      atorvastatin (LIPITOR) 40 MG tablet Take by mouth.     cyclobenzaprine (FLEXERIL) 10 MG tablet Take 5-10 mg by mouth every 8 (eight) hours as needed.     enalapril (VASOTEC) 20 MG tablet Take 20 mg by mouth 2 (two) times daily.      hydrochlorothiazide (HYDRODIURIL) 25 MG tablet Take 25 mg by mouth daily.      metFORMIN (GLUCOPHAGE-XR) 500 MG 24 hr tablet Take 500 mg by mouth daily with breakfast.      metoprolol succinate (TOPROL-XL) 50 MG 24 hr tablet Take 50 mg by mouth daily. Take with or immediately following a meal.     omeprazole (PRILOSEC) 20 MG capsule Take 20 mg by mouth daily at 2 PM.     potassium chloride (KLOR-CON) 8 MEQ tablet Take 8 mEq by mouth daily.      No current facility-administered medications for this visit.    Patient confirms/reports the following allergies:  No Known Allergies  No orders of the defined types were placed in this encounter.   AUTHORIZATION INFORMATION Primary  Insurance: 1D#: Group #:  Secondary Insurance: 1D#: Group #:  SCHEDULE INFORMATION: Date: 05/08/23 Time: Location: [facility]

## 2023-03-27 ENCOUNTER — Other Ambulatory Visit: Payer: Self-pay

## 2023-03-27 ENCOUNTER — Inpatient Hospital Stay: Payer: Self-pay | Attending: Obstetrics and Gynecology

## 2023-03-27 DIAGNOSIS — Z803 Family history of malignant neoplasm of breast: Secondary | ICD-10-CM | POA: Insufficient documentation

## 2023-03-27 DIAGNOSIS — Z79899 Other long term (current) drug therapy: Secondary | ICD-10-CM | POA: Insufficient documentation

## 2023-03-27 DIAGNOSIS — C182 Malignant neoplasm of ascending colon: Secondary | ICD-10-CM | POA: Insufficient documentation

## 2023-03-27 DIAGNOSIS — I2510 Atherosclerotic heart disease of native coronary artery without angina pectoris: Secondary | ICD-10-CM | POA: Insufficient documentation

## 2023-03-27 DIAGNOSIS — Z9049 Acquired absence of other specified parts of digestive tract: Secondary | ICD-10-CM | POA: Insufficient documentation

## 2023-03-27 DIAGNOSIS — Z8042 Family history of malignant neoplasm of prostate: Secondary | ICD-10-CM | POA: Insufficient documentation

## 2023-03-27 LAB — CMP (CANCER CENTER ONLY)
ALT: 28 U/L (ref 0–44)
AST: 27 U/L (ref 15–41)
Albumin: 4.2 g/dL (ref 3.5–5.0)
Alkaline Phosphatase: 58 U/L (ref 38–126)
Anion gap: 9 (ref 5–15)
BUN: 11 mg/dL (ref 8–23)
CO2: 29 mmol/L (ref 22–32)
Calcium: 9.3 mg/dL (ref 8.9–10.3)
Chloride: 99 mmol/L (ref 98–111)
Creatinine: 1.02 mg/dL (ref 0.61–1.24)
GFR, Estimated: >60 mL/min (ref >60)
Glucose, Bld: 120 mg/dL — ABNORMAL HIGH (ref 70–99)
Potassium: 3.8 mmol/L (ref 3.5–5.1)
Sodium: 137 mmol/L (ref 135–145)
Total Bilirubin: 1 mg/dL (ref 0.3–1.2)
Total Protein: 7.6 g/dL (ref 6.5–8.1)

## 2023-03-27 LAB — CBC WITH DIFFERENTIAL (CANCER CENTER ONLY)
Abs Immature Granulocytes: 0.02 10*3/uL (ref 0.00–0.07)
Basophils Absolute: 0 10*3/uL (ref 0.0–0.1)
Basophils Relative: 1 %
Eosinophils Absolute: 0.1 10*3/uL (ref 0.0–0.5)
Eosinophils Relative: 2 %
HCT: 41.1 % (ref 39.0–52.0)
Hemoglobin: 14 g/dL (ref 13.0–17.0)
Immature Granulocytes: 1 %
Lymphocytes Relative: 43 %
Lymphs Abs: 1.7 10*3/uL (ref 0.7–4.0)
MCH: 32.1 pg (ref 26.0–34.0)
MCHC: 34.1 g/dL (ref 30.0–36.0)
MCV: 94.3 fL (ref 80.0–100.0)
Monocytes Absolute: 0.4 10*3/uL (ref 0.1–1.0)
Monocytes Relative: 10 %
Neutro Abs: 1.7 10*3/uL (ref 1.7–7.7)
Neutrophils Relative %: 43 %
Platelet Count: 146 10*3/uL — ABNORMAL LOW (ref 150–400)
RBC: 4.36 MIL/uL (ref 4.22–5.81)
RDW: 13.1 % (ref 11.5–15.5)
WBC Count: 3.8 10*3/uL — ABNORMAL LOW (ref 4.0–10.5)
nRBC: 0 % (ref 0.0–0.2)

## 2023-03-28 LAB — CEA: CEA: 2.8 ng/mL (ref 0.0–4.7)

## 2023-03-29 ENCOUNTER — Inpatient Hospital Stay: Payer: Self-pay | Admitting: Oncology

## 2023-03-29 ENCOUNTER — Encounter: Payer: Self-pay | Admitting: Oncology

## 2023-03-29 NOTE — Assessment & Plan Note (Deleted)
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 Clinically patient is doing very well. Labs are reviewed and discussed with patient. CEA is normal and is stable. Patient will need to repeat colonoscopy in September 2024

## 2023-04-03 ENCOUNTER — Inpatient Hospital Stay (HOSPITAL_BASED_OUTPATIENT_CLINIC_OR_DEPARTMENT_OTHER): Payer: Self-pay | Admitting: Oncology

## 2023-04-03 ENCOUNTER — Encounter: Payer: Self-pay | Admitting: Oncology

## 2023-04-03 VITALS — BP 144/91 | HR 61 | Temp 96.4°F | Resp 18 | Wt 168.8 lb

## 2023-04-03 DIAGNOSIS — Z809 Family history of malignant neoplasm, unspecified: Secondary | ICD-10-CM

## 2023-04-03 DIAGNOSIS — C182 Malignant neoplasm of ascending colon: Secondary | ICD-10-CM

## 2023-04-03 NOTE — Assessment & Plan Note (Signed)
Refer to genetic counseling. 

## 2023-04-03 NOTE — Progress Notes (Signed)
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 Clinically patient is doing very well. Labs are reviewed and discussed with patient. CEA is normal and is stable. Patient will  repeat colonoscopy in September 2024  Family history of cancer Refer to genetic counseling.   Orders Placed This Encounter  Procedures   CBC with Differential (Cancer Center Only)    Standing Status:   Future    Standing Expiration Date:   04/02/2024   CMP (Cancer Center only)    Standing Status:   Future    Standing Expiration Date:   04/02/2024   CEA    Standing Status:   Future    Standing Expiration Date:   04/02/2024   Ambulatory referral to Genetics    Referral Priority:   Routine    Referral Type:   Consultation    Referral Reason:   Specialty Services Required    Number of Visits Requested:   1   Follow up in 1 year All questions were answered. The patient knows to call the clinic with any problems, questions or concerns.  Rickard Patience, MD, PhD [facility] Hematology Oncology 04/03/2023    HISTORY OF PRESENTING ILLNESS:   Dalton Villarreal is a  77 y.o.  male with PMH listed below was seen in consultation at the request of  Aycock, Sylvie Farrier, MD  for evaluation of colon cancer Patient recently had a routine screening colonoscopy on 04/21/2019. 04/21/2007 colonoscopy showed a fungating nonobstructive large mass was found in the proximal ascending colon.  The mass was not circumferential.  Mass was biopsied. 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 Patient reports he is feeling well at  baseline.  Denies any fever, chills, unintentional weight loss, change of bowel habit, blood in the stool, or abdominal pain.  Family history positive for brother with prostate cancer, mother passed away from breast cancer.  #Family history of cancer, recommend genetic testing.  Patient wants to defer.  #Radiographic evidence of CAD on CT scanning.  Discussed with patient.  Lifestyle modification discussed.  Recommend patient to continue follow-up with primary care physician for further management.  # 05/26/2019 patient underwent right hemicolectomy. Pathology positive for invasive adenocarcinoma.  21 lymph node harvested.  Lymphovascular invasion present.  Perineural invasion not identified.  Tumor deposits not identified.  All margins are negative.  pT1 pN0  # 1 year post surgery colonoscopy -05/06/2020, patient has had colonoscopy done by Dr. Tobi Bastos.  Healthy-appearing mucosa at the end to end ileocolonic anastomosis.  Examination was otherwise normal.  No specimens were collected.  INTERVAL HISTORY Dalton Villarreal is a 77 y.o. male who has above history reviewed by me today presents for follow up visit for management of colon cancer Patient reports feeling well today.  Patient reports having good appetite.  Weight has been stable. He denies any nausea vomiting diarrhea abdominal pain.  No blood in the stool.   Review of Systems  Constitutional:  Negative for appetite change, chills, fatigue, fever and unexpected weight change.  HENT:   Negative for hearing loss and voice change.   Eyes:  Negative for eye problems and  icterus.  Respiratory:  Negative for chest tightness, cough and shortness of breath.   Cardiovascular:  Negative for chest pain and leg swelling.  Gastrointestinal:  Negative for abdominal distention and abdominal pain.  Endocrine: Negative for hot flashes.  Genitourinary:  Negative for difficulty urinating, dysuria and frequency.   Musculoskeletal:  Negative for  arthralgias.  Skin:  Negative for itching and rash.  Neurological:  Negative for light-headedness and numbness.  Hematological:  Negative for adenopathy. Does not bruise/bleed easily.  Psychiatric/Behavioral:  Negative for confusion.     MEDICAL HISTORY:  Past Medical History:  Diagnosis Date   Cancer [facility])    colon cancer   Diabetes mellitus without complication (HCC)    Hypercholesteremia    Hypertension    Ruptured lumbar disc     SURGICAL HISTORY: Past Surgical History:  Procedure Laterality Date   COLON SURGERY     COLONOSCOPY     COLONOSCOPY WITH PROPOFOL N/A 04/21/2019   Procedure: COLONOSCOPY WITH PROPOFOL;  Surgeon: Wyline Mood, MD;  Location: [facility] ENDOSCOPY;  Service: Gastroenterology;  Laterality: N/A;   COLONOSCOPY WITH PROPOFOL N/A 05/06/2020   Procedure: COLONOSCOPY WITH PROPOFOL;  Surgeon: Wyline Mood, MD;  Location: [facility] ENDOSCOPY;  Service: Gastroenterology;  Laterality: N/A;   EYE SURGERY Bilateral    cataracts bilaterally   HOLEP-LASER ENUCLEATION OF THE PROSTATE WITH MORCELLATION N/A 11/18/2020   Procedure: HOLEP-LASER ENUCLEATION OF THE PROSTATE WITH MORCELLATION;  Surgeon: Sondra Come, MD;  Location: [facility] ORS;  Service: Urology;  Laterality: N/A;   LAPAROSCOPIC RIGHT COLECTOMY N/A 05/26/2019   Procedure: LAPAROSCOPIC RIGHT COLECTOMY;  Surgeon: Leafy Ro, MD;  Location: [facility] ORS;  Service: General;  Laterality: N/A;   LUMBAR LAMINECTOMY     UPPER GI ENDOSCOPY      SOCIAL HISTORY: Social History   Socioeconomic History   Marital status: Legally Separated    Spouse name: Not on file   Number of children: Not on file   Years of education: Not on file   Highest education level: Not on file  Occupational History   Not on file  Tobacco Use   Smoking status: Never   Smokeless tobacco: Never  Vaping Use   Vaping status: Never Used  Substance and Sexual Activity   Alcohol use: Not Currently   Drug use: Never   Sexual activity: Yes    Birth  control/protection: None  Other Topics Concern   Not on file  Social History Narrative   Not on file   Social Determinants of Health   Financial Resource Strain: Not on file  Food Insecurity: Not on file  Transportation Needs: Not on file  Physical Activity: Not on file  Stress: Not on file  Social Connections: Not on file  Intimate Partner Violence: Not on file    FAMILY HISTORY: Family History  Problem Relation Age of Onset   Breast cancer Mother    Prostate cancer Brother     ALLERGIES:  has No Known Allergies.  MEDICATIONS:  Current Outpatient Medications  Medication Sig Dispense Refill   ASPIRIN LOW DOSE 81 MG EC tablet Take 81 mg by mouth daily.      atorvastatin (LIPITOR) 40 MG tablet Take by mouth.     cyclobenzaprine (FLEXERIL) 10 MG tablet Take 5-10 mg by mouth every 8 (eight) hours as needed.     enalapril (VASOTEC) 20 MG tablet Take 20 mg by mouth 2 (two) times daily.      hydrochlorothiazide (HYDRODIURIL) 25 MG tablet Take 25  mg by mouth daily.      metFORMIN (GLUCOPHAGE-XR) 500 MG 24 hr tablet Take 500 mg by mouth daily with breakfast.      metoprolol succinate (TOPROL-XL) 50 MG 24 hr tablet Take 50 mg by mouth daily. Take with or immediately following a meal.     omeprazole (PRILOSEC) 20 MG capsule Take 20 mg by mouth daily at 2 PM.     potassium chloride (KLOR-CON) 8 MEQ tablet Take 8 mEq by mouth daily.      No current facility-administered medications for this visit.     PHYSICAL EXAMINATION: ECOG PERFORMANCE STATUS: 1 - Symptomatic but completely ambulatory There were no vitals filed for this visit.  Filed Weights   04/03/23 0946  Weight: 168 lb 12.8 oz (76.6 kg)    Physical Exam Constitutional:      General: He is not in acute distress. HENT:     Head: Normocephalic and atraumatic.  Eyes:     General: No scleral icterus.    Pupils: Pupils are equal, round, and reactive to light.  Cardiovascular:     Rate and Rhythm: Normal rate and  regular rhythm.     Heart sounds: Normal heart sounds.  Pulmonary:     Effort: Pulmonary effort is normal. No respiratory distress.     Breath sounds: No wheezing.  Abdominal:     General: There is no distension.     Palpations: Abdomen is soft.  Musculoskeletal:        General: No deformity. Normal range of motion.     Cervical back: Normal range of motion and neck supple.  Skin:    General: Skin is warm and dry.     Findings: No erythema or rash.  Neurological:     Mental Status: He is alert and oriented to person, place, and time.     Cranial Nerves: No cranial nerve deficit.     Coordination: Coordination normal.  Psychiatric:        Mood and Affect: Mood normal.     LABORATORY DATA:  I have reviewed the data as listed    Latest Ref Rng & Units 03/27/2023    2:08 PM 03/26/2022   10:51 AM 03/10/2021    8:11 AM  CBC  WBC 4.0 - 10.5 K/uL 3.8  4.7  3.3   Hemoglobin 13.0 - 17.0 g/dL 44.0  10.2  72.5   Hematocrit 39.0 - 52.0 % 41.1  34.7  40.3   Platelets 150 - 400 K/uL 146  156  156       Latest Ref Rng & Units 03/27/2023    2:08 PM 03/28/2022    2:15 PM 03/26/2022   10:51 AM  CMP  Glucose 70 - 99 mg/dL 366  440  347   BUN 8 - 23 mg/dL 11  15  14    Creatinine 0.61 - 1.24 mg/dL 4.25  9.56  3.87   Sodium 135 - 145 mmol/L 137  138  136   Potassium 3.5 - 5.1 mmol/L 3.8  3.0  2.8   Chloride 98 - 111 mmol/L 99  103  99   CO2 22 - 32 mmol/L 29  28  27    Calcium 8.9 - 10.3 mg/dL 9.3  8.7  8.3   Total Protein 6.5 - 8.1 g/dL 7.6   6.5   Total Bilirubin 0.3 - 1.2 mg/dL 1.0   0.4   Alkaline Phos 38 - 126 U/L 58   58   AST 15 -  41 U/L 27   26   ALT 0 - 44 U/L 28   24      Iron/TIBC/Ferritin/ %Sat    Component Value Date/Time   IRON 58 09/12/2020 1328   TIBC 294 09/12/2020 1328   FERRITIN 74 09/12/2020 1328   IRONPCTSAT 20 09/12/2020 1328     Baseline CEA has been checked on 04/23/2019 which was 3.7.  RADIOGRAPHIC STUDIES: I have personally reviewed the radiological  images as listed and agreed with the findings in the report. No results found.

## 2023-04-03 NOTE — Assessment & Plan Note (Addendum)
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 Clinically patient is doing very well. Labs are reviewed and discussed with patient. CEA is normal and is stable. Patient will  repeat colonoscopy in September 2024

## 2023-04-23 ENCOUNTER — Other Ambulatory Visit: Payer: Self-pay

## 2023-04-23 ENCOUNTER — Encounter: Payer: Self-pay | Admitting: Licensed Clinical Social Worker

## 2023-05-08 ENCOUNTER — Telehealth: Payer: Self-pay | Admitting: *Deleted

## 2023-05-08 ENCOUNTER — Ambulatory Visit: Admission: RE | Admit: 2023-05-08 | Payer: Self-pay | Source: Home / Self Care | Admitting: Gastroenterology

## 2023-05-08 ENCOUNTER — Encounter: Admission: RE | Payer: Self-pay | Source: Home / Self Care

## 2023-05-08 SURGERY — COLONOSCOPY WITH PROPOFOL
Anesthesia: General

## 2023-05-08 MED ORDER — PROPOFOL 1000 MG/100ML IV EMUL
INTRAVENOUS | Status: AC
  Filled 2023-05-08: qty 100

## 2023-05-08 NOTE — Op Note (Signed)
[facility] Gastroenterology Patient Name: Dalton Villarreal Procedure Date: 05/08/2023 7:04 AM MRN: [id number] Account #: [id number] Date of Birth: 07/22/1946 Admit Type: Outpatient Age: 77 Room: [facility] ENDO ROOM 3 Gender: Male Note Status: Finalized Instrument Name: Prentice Docker [id number] Procedure:             Colonoscopy Indications:           Screening in patient at increased risk: Family history                         of 1st-degree relative with colorectal cancer Providers:             Wyline Mood MD, MD Referring MD:          Sylvie Farrier. Aycock MD (Referring MD) Medicines:             Monitored Anesthesia Care Complications:         No immediate complications. Procedure:             Pre-Anesthesia Assessment:                        - Prior to the procedure, a History and Physical was                         performed, and patient medications, allergies and                         sensitivities were reviewed. The patient's tolerance                         of previous anesthesia was reviewed.                        - The risks and benefits of the procedure and the                         sedation options and risks were discussed with the                         patient. All questions were answered and informed                         consent was obtained.                        - ASA Grade Assessment: II - A patient with mild                         systemic disease.                        After obtaining informed consent, the colonoscope was                         passed under direct vision. Throughout the procedure,                         the patient's blood pressure, pulse, and oxygen  saturations were monitored continuously. The                         Colonoscope was introduced through the anus with the                         intention of advancing to the cecum. The scope was                         advanced to the sigmoid colon before the  procedure was                         aborted. Medications were given. The colonoscopy was                         performed with ease. The patient tolerated the                         procedure well. The quality of the bowel preparation                         was unsatisfactory. Findings:      A large amount of semi-liquid stool was found in the rectum and in the       sigmoid colon, interfering with visualization. Impression:            - Preparation of the colon was unsatisfactory.                        - Stool in the rectum and in the sigmoid colon.                        - No specimens collected. Recommendation:        - Discharge patient to home (with escort).                        - Resume previous diet.                        - Continue present medications.                        - Repeat colonoscopy in 3 weeks because the bowel                         preparation was suboptimal. Procedure Code(s):     --- Professional ---                        [phone], 53, Colonoscopy, flexible; diagnostic,                         including collection of specimen(s) by brushing or                         washing, when performed (separate procedure) Diagnosis Code(s):     --- Professional ---                        Z80.0, Family history of malignant neoplasm of  digestive organs CPT copyright 2022 American Medical Association. All rights reserved. The codes documented in this report are preliminary and upon coder review may  be revised to meet current compliance requirements. Wyline Mood, MD Wyline Mood MD, MD 05/08/2023 7:56:58 AM This report has been signed electronically. Number of Addenda: 0 Note Initiated On: 05/08/2023 7:04 AM Total Procedure Duration: 0 hours 1 minute 34 seconds  Estimated Blood Loss:  Estimated blood loss: none.      [facility]

## 2023-05-08 NOTE — Telephone Encounter (Signed)
Received an incomplete voicemail on my phone regarding patient.  I tried to call patient this morning but patient did not answer, voicemail box is also full.  I have called endo unit this morning because patient is schedule for a colonoscopy and did not show up. Also they mention patient had soup yesterday.  I have called patient's daughter who is on the DPR who seems to not know anything about the colonoscopy. She stated that she will let him know.

## 2023-05-14 ENCOUNTER — Inpatient Hospital Stay: Payer: Self-pay | Attending: Obstetrics and Gynecology | Admitting: Licensed Clinical Social Worker

## 2023-05-14 ENCOUNTER — Inpatient Hospital Stay: Payer: Self-pay

## 2023-05-14 NOTE — Progress Notes (Deleted)
REFERRING PROVIDER: Rickard Patience, MD [address] [place],  [place] [address]  PRIMARY PROVIDER:  Emogene Morgan, MD  PRIMARY REASON FOR VISIT:  1. Malignant neoplasm of ascending colon (HCC)   2. Family history of prostate cancer   3. Family history of colon cancer      HISTORY OF PRESENT ILLNESS:   Mr. Hamme, a 77 y.o. male, was seen for a [facility] cancer genetics consultation at the request of Dr. Cathie Hoops due to a personal and family history of cancer.  Mr. Steffler presents to clinic today to discuss the possibility of a hereditary predisposition to cancer, genetic testing, and to further clarify his future cancer risks, as well as potential cancer risks for family members.   In 2020, at the age of 51, Mr. Lardizabal was diagnosed with colon cancer. This was treated with right hemicolectomy, MMR intact. CANCER HISTORY:  Oncology History  Malignant neoplasm of colon (HCC)  05/01/2019 Initial Diagnosis   Malignant neoplasm of colon (HCC)   06/13/2019 Cancer Staging   Staging form: Colon and Rectum, AJCC 8th Edition - Pathologic: Stage I (pT1, pN0, cM0) - Signed by Rickard Patience, MD on 06/13/2019    Past Medical History:  Diagnosis Date   Cancer (HCC)    colon cancer   Diabetes mellitus without complication (HCC)    Hypercholesteremia    Hypertension    Ruptured lumbar disc     Past Surgical History:  Procedure Laterality Date   COLON SURGERY     COLONOSCOPY     COLONOSCOPY WITH PROPOFOL N/A 04/21/2019   Procedure: COLONOSCOPY WITH PROPOFOL;  Surgeon: Wyline Mood, MD;  Location: [facility] ENDOSCOPY;  Service: Gastroenterology;  Laterality: N/A;   COLONOSCOPY WITH PROPOFOL N/A 05/06/2020   Procedure: COLONOSCOPY WITH PROPOFOL;  Surgeon: Wyline Mood, MD;  Location: [facility] ENDOSCOPY;  Service: Gastroenterology;  Laterality: N/A;   EYE SURGERY Bilateral    cataracts bilaterally   HOLEP-LASER ENUCLEATION OF THE PROSTATE WITH MORCELLATION N/A 11/18/2020   Procedure: HOLEP-LASER ENUCLEATION OF THE  PROSTATE WITH MORCELLATION;  Surgeon: Sondra Come, MD;  Location: [facility] ORS;  Service: Urology;  Laterality: N/A;   LAPAROSCOPIC RIGHT COLECTOMY N/A 05/26/2019   Procedure: LAPAROSCOPIC RIGHT COLECTOMY;  Surgeon: Leafy Ro, MD;  Location: [facility] ORS;  Service: General;  Laterality: N/A;   LUMBAR LAMINECTOMY     UPPER GI ENDOSCOPY      FAMILY HISTORY:  We obtained a detailed, 4-generation family history.  Significant diagnoses are listed below: Family History  Problem Relation Age of Onset   Breast cancer Mother    Prostate cancer Brother     Mr. Holderman is unaware of previous family history of genetic testing for hereditary cancer risks. There is no reported Ashkenazi Jewish ancestry. There is no known consanguinity.  GENETIC COUNSELING ASSESSMENT: Mr. Manasco is a 77 y.o. male with a personal history of colon cancer which is somewhat suggestive of a hereditary cancer syndrome and predisposition to cancer. We, therefore, discussed and recommended the following at today's visit.   DISCUSSION: We discussed that approximately 10% of colorectal cancer is hereditary. Most cases of hereditary colorectal cancer are associated with Lynch syndrome genes, although there are other genes associated with hereditary cancer as well. Cancers and risks are gene specific. We discussed that testing is beneficial for several reasons including knowing about cancer risks, identifying potential screening and risk-reduction options that may be appropriate, and to understand if other family members could be at risk for cancer and  allow them to undergo genetic testing.   We reviewed the characteristics, features and inheritance patterns of hereditary cancer syndromes. We also discussed genetic testing, including the appropriate family members to test, the process of testing, insurance coverage and turn-around-time for results. We discussed the implications of a negative, positive and/or variant of uncertain  significant result. We recommended Mr. Joseph Art pursue genetic testing for the [employer] Common Hereditary Cancers+RNA gene panel.   Based on Mr. Asato's personal and family history of cancer, he meets medical criteria for genetic testing (NCCN v.1.2024 - germline evaluation for all individuals with CRC). Despite that he meets criteria, he may still have an out of pocket cost.   PLAN: After considering the risks, benefits, and limitations, Mr. Fluegge provided informed consent to pursue genetic testing and the blood sample was sent to [facility] for analysis of the Common Hereditary Cancers+RNA panel. Results should be available within approximately 2-3 weeks' time, at which point they will be disclosed by telephone to Mr. Beretta, as will any additional recommendations warranted by these results. Mr. Repka will receive a summary of his genetic counseling visit and a copy of his results once available. This information will also be available in Epic.   *** Despite our recommendation, Mr. Lamorte did not wish to pursue genetic testing at today's visit. We understand this decision and remain available to coordinate genetic testing at any time in the future. We, therefore, recommend Mr. Corti continue to follow the cancer screening guidelines given by his primary healthcare provider.  ***Based on Mr. Ginsberg's family history, we recommended his *** have genetic counseling and testing. Mr. Cumpton will let us know if we can be of any assistance in coordinating genetic counseling and/or testing for this family member.   Mr. Zilka questions were answered to his satisfaction today. Our contact information was provided should additional questions or concerns arise. Thank you for the referral and allowing [place] to share in the care of your patient.   Lacy Duverney, MS, [facility] Genetic Counselor [email or web address] Phone: [phone]  The patient was seen for a total of *** minutes in face-to-face genetic  counseling.  Dr. Blake Divine was available for discussion regarding this case.   _______________________________________________________________________ For Office Staff:  Number of people involved in session: *** Was an Intern/ student involved with case: no

## 2023-06-10 ENCOUNTER — Telehealth: Payer: Self-pay | Admitting: Gastroenterology

## 2023-06-10 NOTE — Telephone Encounter (Signed)
Patient called in to schedule office visit with Dr. Tobi Bastos. Patient has some question about an medication (Nulytely).

## 2023-06-12 NOTE — Telephone Encounter (Signed)
Called patient but was not able to leave him a voicemail since it's currently full.

## 2023-06-12 NOTE — Telephone Encounter (Signed)
Dr. Tobi Bastos, I am not sure why patient wants to see you and/or why he was scheduled an appointment. But I see that you wanted him to have a repeat colonoscopy. Can I cancel his appointment once I reschedule his colonoscopy?

## 2023-06-13 NOTE — Telephone Encounter (Signed)
Called patient but was not able to leave him a voicemail as it stated that it was full. I will try to call him later.

## 2023-06-26 NOTE — Telephone Encounter (Signed)
Called patient again and was not able to leave him a voicemail as it was still full. I will send him a letter letting him know that if it's only for a screening colonoscopy, then he doesn't need an office visit but if he insisted in to have an office visit, then to see Inetta Fermo per Dr. Tobi Bastos.

## 2023-07-03 ENCOUNTER — Other Ambulatory Visit: Payer: Self-pay

## 2023-07-03 ENCOUNTER — Telehealth: Payer: Self-pay

## 2023-07-03 DIAGNOSIS — Z800 Family history of malignant neoplasm of digestive organs: Secondary | ICD-10-CM

## 2023-07-03 DIAGNOSIS — Z85038 Personal history of other malignant neoplasm of large intestine: Secondary | ICD-10-CM

## 2023-07-03 NOTE — Telephone Encounter (Signed)
Patient called stating that he was calling to ask at what time he was to start taking his medication to prep for his colonoscopy. I told him that it was scheduled for an appointment and not a colonoscopy. Therefore, I told the patient that I would cancel tomorrow's appointment and schedule him his colonoscopy. Patient agreed and decided to have it don on Tuesday 07/09/2023. I explained to him that he is to hold his metformin 2 days prior and take his medication for his blood pressure with just a sip of water and also explained how he would be taking his GoLytely. Patient understood and had no further questions.

## 2023-07-04 ENCOUNTER — Ambulatory Visit: Payer: Self-pay | Admitting: Gastroenterology

## 2023-07-08 ENCOUNTER — Encounter: Payer: Self-pay | Admitting: Gastroenterology

## 2023-07-09 ENCOUNTER — Ambulatory Visit
Admission: RE | Admit: 2023-07-09 | Discharge: 2023-07-09 | Disposition: A | Discharge status: Home / Self Care | Payer: Self-pay | Attending: Gastroenterology | Admitting: Gastroenterology

## 2023-07-09 ENCOUNTER — Encounter: Payer: Self-pay | Admitting: Anesthesiology

## 2023-07-09 ENCOUNTER — Encounter
Admission: RE | Disposition: A | Discharge status: Home / Self Care | Payer: Self-pay | Source: Home / Self Care | Attending: Gastroenterology

## 2023-07-09 SURGERY — COLONOSCOPY WITH PROPOFOL
Anesthesia: General

## 2023-07-24 ENCOUNTER — Other Ambulatory Visit: Payer: Self-pay

## 2023-07-24 ENCOUNTER — Telehealth: Payer: Self-pay

## 2023-07-24 DIAGNOSIS — Z1211 Encounter for screening for malignant neoplasm of colon: Secondary | ICD-10-CM

## 2023-07-24 DIAGNOSIS — Z809 Family history of malignant neoplasm, unspecified: Secondary | ICD-10-CM

## 2023-07-24 DIAGNOSIS — Z800 Family history of malignant neoplasm of digestive organs: Secondary | ICD-10-CM

## 2023-07-24 NOTE — Telephone Encounter (Signed)
Colonoscopy has been scheduled with Dr. Tobi Bastos 08/28/23.  2 day prep with miralax and gatorade in addition to Golytely.

## 2023-08-22 ENCOUNTER — Telehealth: Payer: Self-pay

## 2023-08-22 MED ORDER — GOLYTELY 236 G PO SOLR: 4000.0000 mL | Freq: Once | ORAL | 0 refills | Status: AC

## 2023-08-22 NOTE — Telephone Encounter (Signed)
 Received message from Dava in Endo stating, pt does not have prep or instructions for 2 day prep. please call him and advise.  Attempted to contact pt to let him know his instructions were sent in the mail the week of 07/29/23.  Golytely  rx sent to pharmacy.  I will try again tomorrow to see if I can reach him to discuss instructions.  Thanks, [place], CMA

## 2023-08-28 ENCOUNTER — Ambulatory Visit: Payer: Self-pay | Admitting: Anesthesiology

## 2023-08-28 ENCOUNTER — Encounter
Admission: RE | Disposition: A | Discharge status: Home / Self Care | Payer: Self-pay | Source: Home / Self Care | Attending: Gastroenterology

## 2023-08-28 ENCOUNTER — Ambulatory Visit
Admission: RE | Admit: 2023-08-28 | Discharge: 2023-08-28 | Disposition: A | Discharge status: Home / Self Care | Payer: Self-pay | Attending: Gastroenterology | Admitting: Gastroenterology

## 2023-08-28 DIAGNOSIS — Z809 Family history of malignant neoplasm, unspecified: Secondary | ICD-10-CM

## 2023-08-28 DIAGNOSIS — Z980 Intestinal bypass and anastomosis status: Secondary | ICD-10-CM | POA: Insufficient documentation

## 2023-08-28 DIAGNOSIS — I10 Essential (primary) hypertension: Secondary | ICD-10-CM | POA: Insufficient documentation

## 2023-08-28 DIAGNOSIS — Z85038 Personal history of other malignant neoplasm of large intestine: Secondary | ICD-10-CM

## 2023-08-28 DIAGNOSIS — Z1211 Encounter for screening for malignant neoplasm of colon: Secondary | ICD-10-CM | POA: Insufficient documentation

## 2023-08-28 DIAGNOSIS — E119 Type 2 diabetes mellitus without complications: Secondary | ICD-10-CM | POA: Insufficient documentation

## 2023-08-28 HISTORY — PX: COLONOSCOPY WITH PROPOFOL: SHX5780

## 2023-08-28 LAB — GLUCOSE, CAPILLARY: Glucose-Capillary: 97 mg/dL (ref 70–99)

## 2023-08-28 SURGERY — COLONOSCOPY WITH PROPOFOL
Anesthesia: General

## 2023-08-28 MED ORDER — LIDOCAINE HCL (CARDIAC) PF 100 MG/5ML IV SOSY
PREFILLED_SYRINGE | INTRAVENOUS | Status: DC | PRN
  Administered 2023-08-28: 80 mg via INTRAVENOUS

## 2023-08-28 MED ORDER — PROPOFOL 10 MG/ML IV BOLUS
INTRAVENOUS | Status: DC | PRN
  Administered 2023-08-28: 50 mg via INTRAVENOUS

## 2023-08-28 MED ORDER — STERILE WATER FOR IRRIGATION IR SOLN
Status: DC | PRN
  Administered 2023-08-28: 50 mL

## 2023-08-28 MED ORDER — PROPOFOL 500 MG/50ML IV EMUL
INTRAVENOUS | Status: DC | PRN
  Administered 2023-08-28: 75 ug/kg/min via INTRAVENOUS

## 2023-08-28 MED ORDER — SODIUM CHLORIDE 0.9 % IV SOLN
INTRAVENOUS | Status: DC
  Administered 2023-08-28: 20 mL/h via INTRAVENOUS

## 2023-08-28 MED ORDER — LIDOCAINE HCL (PF) 2 % IJ SOLN
INTRAMUSCULAR | Status: AC
Start: 2023-08-28 — End: ?
  Filled 2023-08-28: qty 5

## 2023-08-28 NOTE — Transfer of Care (Signed)
 Immediate Anesthesia Transfer of Care Note  Patient: Dalton Villarreal  Procedure(s) Performed: COLONOSCOPY WITH PROPOFOL   Patient Location: PACU  Anesthesia Type:General  Level of Consciousness: sedated  Airway & Oxygen Therapy: Patient Spontanous Breathing  Post-op Assessment: Report given to RN and Post -op Vital signs reviewed and stable  Post vital signs: Reviewed and stable  Last Vitals:  Vitals Value Taken Time  BP    Temp    Pulse    Resp    SpO2      Last Pain:  Vitals:   08/28/23 1023  TempSrc: Temporal  PainSc: 0-No pain         Complications: No notable events documented.

## 2023-08-28 NOTE — Anesthesia Preprocedure Evaluation (Addendum)
 Anesthesia Evaluation  Patient identified by MRN, date of birth, ID band Patient awake    Reviewed: Allergy & Precautions, NPO status , Patient's Chart, lab work & pertinent test results  History of Anesthesia Complications Negative for: history of anesthetic complications  Airway Mallampati: III  TM Distance: >3 FB Neck ROM: full    Dental  (+) Edentulous Upper, Edentulous Lower   Pulmonary neg pulmonary ROS   Pulmonary exam normal        Cardiovascular hypertension, On Medications Normal cardiovascular exam     Neuro/Psych negative neurological ROS  negative psych ROS   GI/Hepatic negative GI ROS, Neg liver ROS,,,  Endo/Other  diabetes    Renal/GU negative Renal ROS  negative genitourinary   Musculoskeletal   Abdominal   Peds  Hematology negative hematology ROS (+)   Anesthesia Other Findings Past Medical History: No date: Cancer (HCC)     Comment:  colon cancer No date: Diabetes mellitus without complication (HCC) No date: Hypercholesteremia No date: Hypertension No date: Ruptured lumbar disc  Past Surgical History: No date: COLON SURGERY No date: COLONOSCOPY 04/21/2019: COLONOSCOPY WITH PROPOFOL ; N/A     Comment:  Procedure: COLONOSCOPY WITH PROPOFOL ;  Surgeon: Luke Salaam, MD;  Location: [facility] ENDOSCOPY;  Service:               Gastroenterology;  Laterality: N/A; 05/06/2020: COLONOSCOPY WITH PROPOFOL ; N/A     Comment:  Procedure: COLONOSCOPY WITH PROPOFOL ;  Surgeon: Luke Salaam, MD;  Location: [facility] ENDOSCOPY;  Service:               Gastroenterology;  Laterality: N/A; No date: EYE SURGERY; Bilateral     Comment:  cataracts bilaterally 11/18/2020: HOLEP-LASER ENUCLEATION OF THE PROSTATE WITH MORCELLATION;  N/A     Comment:  Procedure: HOLEP-LASER ENUCLEATION OF THE PROSTATE WITH               MORCELLATION;  Surgeon: Lawerence Pressman, MD;  Location:               [facility] ORS;   Service: Urology;  Laterality: N/A; 05/26/2019: LAPAROSCOPIC RIGHT COLECTOMY; N/A     Comment:  Procedure: LAPAROSCOPIC RIGHT COLECTOMY;  Surgeon:               Alben Alma, MD;  Location: [facility] ORS;  Service:               General;  Laterality: N/A; No date: LUMBAR LAMINECTOMY No date: UPPER GI ENDOSCOPY     Reproductive/Obstetrics negative OB ROS                             Anesthesia Physical Anesthesia Plan  ASA: 3  Anesthesia Plan: General   Post-op Pain Management: Minimal or no pain anticipated   Induction: Intravenous  PONV Risk Score and Plan: 1 and Propofol  infusion and TIVA  Airway Management Planned: Natural Airway and Nasal Cannula  Additional Equipment:   Intra-op Plan:   Post-operative Plan:   Informed Consent: I have reviewed the patients History and Physical, chart, labs and discussed the procedure including the risks, benefits and alternatives for the proposed anesthesia with the patient or authorized representative who has indicated his/her understanding and acceptance.     Dental Advisory Given  Plan Discussed with:  Anesthesiologist, CRNA and Surgeon  Anesthesia Plan Comments: (Patient consented for risks of anesthesia including but not limited to:  - adverse reactions to medications - risk of airway placement if required - damage to eyes, teeth, lips or other oral mucosa - nerve damage due to positioning  - sore throat or hoarseness - Damage to heart, brain, nerves, lungs, other parts of body or loss of life  Patient voiced understanding and assent.)       Anesthesia Quick Evaluation

## 2023-08-28 NOTE — H&P (Signed)
 Dalton Salaam, MD [address], [address], [place], [place], [address] [address], [address], [place], [place], [address] Phone: [phone]  Fax: [phone]  Primary Care Physician:  Dalton Roosevelt, MD   Pre-Procedure History & Physical: HPI:  Dalton Villarreal is a 78 y.o. male is here for an colonoscopy.   Past Medical History:  Diagnosis Date   Cancer [facility])    colon cancer   Diabetes mellitus without complication (HCC)    Hypercholesteremia    Hypertension    Ruptured lumbar disc     Past Surgical History:  Procedure Laterality Date   COLON SURGERY     COLONOSCOPY     COLONOSCOPY WITH PROPOFOL  N/A 04/21/2019   Procedure: COLONOSCOPY WITH PROPOFOL ;  Surgeon: Dalton Salaam, MD;  Location: [facility] ENDOSCOPY;  Service: Gastroenterology;  Laterality: N/A;   COLONOSCOPY WITH PROPOFOL  N/A 05/06/2020   Procedure: COLONOSCOPY WITH PROPOFOL ;  Surgeon: Dalton Salaam, MD;  Location: [facility] ENDOSCOPY;  Service: Gastroenterology;  Laterality: N/A;   EYE SURGERY Bilateral    cataracts bilaterally   HOLEP-LASER ENUCLEATION OF THE PROSTATE WITH MORCELLATION N/A 11/18/2020   Procedure: HOLEP-LASER ENUCLEATION OF THE PROSTATE WITH MORCELLATION;  Surgeon: Lawerence Pressman, MD;  Location: [facility] ORS;  Service: Urology;  Laterality: N/A;   LAPAROSCOPIC RIGHT COLECTOMY N/A 05/26/2019   Procedure: LAPAROSCOPIC RIGHT COLECTOMY;  Surgeon: Alben Alma, MD;  Location: [facility] ORS;  Service: General;  Laterality: N/A;   LUMBAR LAMINECTOMY     UPPER GI ENDOSCOPY      Prior to Admission medications   Medication Sig Start Date End Date Taking? Authorizing Provider  ASPIRIN LOW DOSE 81 MG EC tablet Take 81 mg by mouth daily.  03/23/19  Yes [provider]  atorvastatin (LIPITOR) 40 MG tablet Take by mouth. 09/19/21  Yes [provider]  cyclobenzaprine  (FLEXERIL ) 10 MG tablet Take 5-10 mg by mouth every 8 (eight) hours as needed. 12/11/22  Yes [provider]  enalapril (VASOTEC) 20 MG  tablet Take 20 mg by mouth 2 (two) times daily.  03/23/19  Yes [provider]  hydrochlorothiazide  (HYDRODIURIL ) 25 MG tablet Take 25 mg by mouth daily.  03/23/19  Yes [provider]  metFORMIN (GLUCOPHAGE-XR) 500 MG 24 hr tablet Take 500 mg by mouth daily with breakfast.  03/23/19  Yes [provider]  metoprolol  succinate (TOPROL -XL) 50 MG 24 hr tablet Take 50 mg by mouth daily. Take with or immediately following a meal.   Yes [provider]  omeprazole (PRILOSEC) 20 MG capsule Take 20 mg by mouth daily at 2 PM. 08/31/21  Yes [provider]  polyethylene glycol-electrolytes (NULYTELY) 420 g solution Take 4,000 mLs by mouth once. 02/18/23  Yes [provider]  potassium chloride (KLOR-CON) 8 MEQ tablet Take 8 mEq by mouth daily.  03/23/19  Yes [provider]    Allergies as of 07/24/2023   (No Known Allergies)    Family History  Problem Relation Age of Onset   Breast cancer Mother    Prostate cancer Brother     Social History   Socioeconomic History   Marital status: Legally Separated    Spouse name: Not on file   Number of children: Not on file   Years of education: Not on file   Highest education level: Not on file  Occupational History   Not on file  Tobacco Use   Smoking status: Never   Smokeless tobacco: Never  Vaping Use  Vaping status: Never Used  Substance and Sexual Activity   Alcohol use: Not Currently   Drug use: Never   Sexual activity: Yes    Birth control/protection: None  Other Topics Concern   Not on file  Social History Narrative   Not on file   Social Drivers of Health   Financial Resource Strain: Not on file  Food Insecurity: Not on file  Transportation Needs: Not on file  Physical Activity: Not on file  Stress: Not on file  Social Connections: Not on file  Intimate Partner Violence: Not on file    Review of Systems: See HPI, otherwise negative ROS  Physical Exam: BP (!) 144/92    Pulse (!) 55   Temp 97.8 F (36.6 C) (Temporal)   Resp 20   Ht 5\' 4"  (1.626 m)   Wt 78.2 kg   SpO2 100%   BMI 29.59 kg/m  General:   Alert,  pleasant and cooperative in NAD Head:  Normocephalic and atraumatic. Neck:  Supple; no masses or thyromegaly. Lungs:  Clear throughout to auscultation, normal respiratory effort.    Heart:  +S1, +S2, Regular rate and rhythm, No edema. Abdomen:  Soft, nontender and nondistended. Normal bowel sounds, without guarding, and without rebound.   Neurologic:  Alert and  oriented x4;  grossly normal neurologically.  Impression/Plan: JARQUIS Villarreal is here for an colonoscopy to be performed for Screening colonoscopy average risk   Risks, benefits, limitations, and alternatives regarding  colonoscopy have been reviewed with the patient.  Questions have been answered.  All parties agreeable.   Dalton Salaam, MD  08/28/2023, 10:27 AM

## 2023-08-28 NOTE — Op Note (Signed)
 [facility] Gastroenterology Patient Name: Dalton Villarreal Procedure Date: 08/28/2023 10:56 AM MRN: [id number] Account #: [id number] Date of Birth: 30-Jun-1946 Admit Type: Outpatient Age: 78 Room: [facility] ENDO ROOM 3 Gender: Male Note Status: Finalized Instrument Name: Charlyn Cooley [id number] Procedure:             Colonoscopy Indications:           High risk colon cancer surveillance: Personal history                         of colon cancer Providers:             Luke Salaam MD, MD Referring MD:          Gerlene Koh. Aycock MD (Referring MD) Medicines:             Monitored Anesthesia Care Complications:         No immediate complications. Procedure:             Pre-Anesthesia Assessment:                        - Prior to the procedure, a History and Physical was                         performed, and patient medications, allergies and                         sensitivities were reviewed. The patient's tolerance                         of previous anesthesia was reviewed.                        - The risks and benefits of the procedure and the                         sedation options and risks were discussed with the                         patient. All questions were answered and informed                         consent was obtained.                        - ASA Grade Assessment: II - A patient with mild                         systemic disease.                        After obtaining informed consent, the colonoscope was                         passed under direct vision. Throughout the procedure,                         the patient's blood pressure, pulse, and oxygen                         saturations were  monitored continuously. The                         Colonoscope was introduced through the anus and                         advanced to the the ileocolonic anastomosis. The                         colonoscopy was performed with ease. The patient                          tolerated the procedure well. The quality of the bowel                         preparation was excellent. Anatomical landmarks were                         photographed. Findings:      The entire examined colon appeared normal.      There was evidence of a prior end-to-end ileo-colonic anastomosis in the       transverse colon. This was characterized by healthy appearing mucosa. Impression:            - The entire examined colon is normal.                        - End-to-end ileo-colonic anastomosis, characterized                         by healthy appearing mucosa.                        - No specimens collected. Recommendation:        - Discharge patient to home (with escort).                        - Resume previous diet.                        - Continue present medications.                        - Repeat colonoscopy in 5 years for screening purposes. Diagnosis Code(s):     --- Professional ---                        Z12.11, Encounter for screening for malignant neoplasm                         of colon                        Z80.0, Family history of malignant neoplasm of                         digestive organs                        Z98.0, Intestinal bypass and anastomosis status Luke Salaam, MD Luke Salaam MD, MD 08/28/2023 11:19:35 AM This report has been signed electronically. Number of Addenda:  0 Note Initiated On: 08/28/2023 10:56 AM Scope Withdrawal Time: 0 hours 4 minutes 35 seconds  Total Procedure Duration: 0 hours 6 minutes 10 seconds  Estimated Blood Loss:  Estimated blood loss: none.      [facility]

## 2023-08-28 NOTE — Anesthesia Postprocedure Evaluation (Signed)
 Anesthesia Post Note  Patient: Dalton Villarreal  Procedure(s) Performed: COLONOSCOPY WITH PROPOFOL   Patient location during evaluation: Endoscopy Anesthesia Type: General Level of consciousness: awake and alert Pain management: pain level controlled Vital Signs Assessment: post-procedure vital signs reviewed and stable Respiratory status: spontaneous breathing, nonlabored ventilation, respiratory function stable and patient connected to nasal cannula oxygen Cardiovascular status: blood pressure returned to baseline and stable Postop Assessment: no apparent nausea or vomiting Anesthetic complications: no   No notable events documented.   Last Vitals:  Vitals:   08/28/23 1119 08/28/23 1129  BP: 124/89 (!) 125/94  Pulse: (!) 57 (!) 53  Resp: 11 10  Temp: (!) 35.9 C   SpO2: 100% 100%    Last Pain:  Vitals:   08/28/23 1129  TempSrc:   PainSc: 0-No pain                 Nancey Awkward

## 2023-08-29 ENCOUNTER — Encounter: Payer: Self-pay | Admitting: Gastroenterology

## 2023-10-11 ENCOUNTER — Other Ambulatory Visit: Payer: Self-pay

## 2023-10-11 MED ORDER — PEG 3350-KCL-NA BICARB-NACL 420 G PO SOLR: ORAL | 0 refills | Status: DC

## 2024-03-12 ENCOUNTER — Other Ambulatory Visit (HOSPITAL_BASED_OUTPATIENT_CLINIC_OR_DEPARTMENT_OTHER): Payer: Self-pay | Admitting: Family Medicine

## 2024-03-12 DIAGNOSIS — M7989 Other specified soft tissue disorders: Secondary | ICD-10-CM

## 2024-03-18 ENCOUNTER — Ambulatory Visit (HOSPITAL_BASED_OUTPATIENT_CLINIC_OR_DEPARTMENT_OTHER)

## 2024-03-30 ENCOUNTER — Telehealth: Payer: Self-pay | Admitting: Oncology

## 2024-03-30 ENCOUNTER — Inpatient Hospital Stay: Payer: Self-pay | Attending: Oncology

## 2024-03-30 NOTE — Telephone Encounter (Signed)
 Pt no showed labs prior to MD appt (md appt later this week). I called and left vm for pt to r/s lab appt

## 2024-04-01 ENCOUNTER — Ambulatory Visit (HOSPITAL_BASED_OUTPATIENT_CLINIC_OR_DEPARTMENT_OTHER)
Admission: RE | Admit: 2024-04-01 | Discharge: 2024-04-01 | Disposition: A | Discharge status: Home / Self Care | Source: Ambulatory Visit | Attending: Family Medicine | Admitting: Family Medicine

## 2024-04-01 ENCOUNTER — Ambulatory Visit (HOSPITAL_BASED_OUTPATIENT_CLINIC_OR_DEPARTMENT_OTHER): Admission: RE | Admit: 2024-04-01 | Source: Ambulatory Visit

## 2024-04-01 DIAGNOSIS — M7989 Other specified soft tissue disorders: Secondary | ICD-10-CM | POA: Insufficient documentation

## 2024-04-02 ENCOUNTER — Inpatient Hospital Stay: Payer: Self-pay | Admitting: Oncology

## 2024-04-02 ENCOUNTER — Encounter: Payer: Self-pay | Admitting: Oncology

## 2024-04-02 ENCOUNTER — Other Ambulatory Visit

## 2024-04-02 NOTE — Assessment & Plan Note (Deleted)
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 Clinically patient is doing very well. Labs are reviewed and discussed with patient. CEA is normal and is stable. Jan 2025  repeat colonoscopy - next due in 5 years

## 2024-04-03 ENCOUNTER — Other Ambulatory Visit: Payer: Self-pay | Admitting: Orthopedic Surgery

## 2024-04-03 DIAGNOSIS — M4807 Spinal stenosis, lumbosacral region: Secondary | ICD-10-CM

## 2024-04-08 ENCOUNTER — Other Ambulatory Visit

## 2024-04-08 ENCOUNTER — Ambulatory Visit: Admission: RE | Admit: 2024-04-08 | Source: Ambulatory Visit

## 2024-04-11 ENCOUNTER — Ambulatory Visit: Admission: RE | Admit: 2024-04-11 | Source: Ambulatory Visit

## 2024-05-11 ENCOUNTER — Other Ambulatory Visit (HOSPITAL_BASED_OUTPATIENT_CLINIC_OR_DEPARTMENT_OTHER): Payer: Self-pay | Admitting: Family Medicine

## 2024-05-11 ENCOUNTER — Ambulatory Visit: Admission: RE | Admit: 2024-05-11 | Source: Ambulatory Visit

## 2024-05-11 DIAGNOSIS — M7989 Other specified soft tissue disorders: Secondary | ICD-10-CM

## 2024-05-12 ENCOUNTER — Inpatient Hospital Stay
Admission: RE | Admit: 2024-05-12 | Discharge: 2024-05-12 | Disposition: A | Discharge status: Home / Self Care | Payer: Self-pay | Source: Ambulatory Visit | Attending: Neurosurgery | Admitting: Neurosurgery

## 2024-05-12 ENCOUNTER — Other Ambulatory Visit: Payer: Self-pay | Admitting: Family Medicine

## 2024-05-12 DIAGNOSIS — Z049 Encounter for examination and observation for unspecified reason: Secondary | ICD-10-CM

## 2024-05-13 NOTE — Telephone Encounter (Signed)
 SABRA

## 2024-05-16 ENCOUNTER — Ambulatory Visit (HOSPITAL_BASED_OUTPATIENT_CLINIC_OR_DEPARTMENT_OTHER)
Admission: RE | Admit: 2024-05-16 | Discharge: 2024-05-16 | Disposition: A | Discharge status: Home / Self Care | Source: Ambulatory Visit | Attending: Family Medicine | Admitting: Family Medicine

## 2024-05-16 DIAGNOSIS — M7989 Other specified soft tissue disorders: Secondary | ICD-10-CM | POA: Diagnosis present

## 2024-05-20 ENCOUNTER — Ambulatory Visit
Admission: RE | Admit: 2024-05-20 | Discharge: 2024-05-20 | Disposition: A | Discharge status: Home / Self Care | Source: Ambulatory Visit | Attending: Physician Assistant | Admitting: Physician Assistant

## 2024-05-20 ENCOUNTER — Ambulatory Visit: Admitting: Physician Assistant

## 2024-05-20 ENCOUNTER — Encounter: Payer: Self-pay | Admitting: Physician Assistant

## 2024-05-20 ENCOUNTER — Ambulatory Visit (INDEPENDENT_AMBULATORY_CARE_PROVIDER_SITE_OTHER): Admitting: Physician Assistant

## 2024-05-20 ENCOUNTER — Ambulatory Visit
Admission: RE | Admit: 2024-05-20 | Discharge: 2024-05-20 | Disposition: A | Discharge status: Home / Self Care | Attending: Physician Assistant | Admitting: Physician Assistant

## 2024-05-20 VITALS — BP 132/74 | Ht 64.0 in | Wt 171.0 lb

## 2024-05-20 DIAGNOSIS — R531 Weakness: Secondary | ICD-10-CM

## 2024-05-20 DIAGNOSIS — M48062 Spinal stenosis, lumbar region with neurogenic claudication: Secondary | ICD-10-CM

## 2024-05-20 DIAGNOSIS — M4316 Spondylolisthesis, lumbar region: Secondary | ICD-10-CM | POA: Diagnosis not present

## 2024-05-20 DIAGNOSIS — R29898 Other symptoms and signs involving the musculoskeletal system: Secondary | ICD-10-CM

## 2024-05-20 NOTE — Progress Notes (Signed)
 Referring Physician:  Lorel Maxie LABOR, MD [address] [place],  [place] [address]  Primary Physician:  Lorel Maxie LABOR, MD  History of Present Illness: 05/20/2024 Dalton Villarreal previous history of bilateral L4-5 hemilaminectomy left-sided microlumbar discectomy with a right L5-S1 microdiscectomy followed by a reexploration at the same levels in 2015.  He comes in today for low back pain radiating down the back of his right leg into his lateral calf and the top of his foot.  This is constant in nature.  He has numbness and tingling in his right foot and in his toes which is constant.  He feels as though he is getting weaker and last month he started falling but he feels like getting worse.  He is concerned.  No saddle anesthesia or changes to incontinence.   Weakness: none Bowel/Bladder Dysfunction: none  Conservative measures:  Physical therapy:  has not participated in recently Multimodal medical therapy including regular antiinflammatories: Flexeril   Injections:  12/29/2019- Right L5-S1 transforaminal ESI 09/04/2019- Right L5-S1 transforaminal ESI  Past Surgery: 11/16/2013--BILATERAL L4-5 HEMILAMINECTOMY AND LEFT SIDED MICRO LUMBAR DISCECTOMY WITH A RIGHT L5S1 MICRO LUMBAR DISCECTOMY  03/10/2014--LAMINOTOMY REEXPLORATION POSTERIOR LUMBAR W/NERVE DECOMP & DISCECTOMY-----RT L5S1 RE-EXPLORATION AND MLD   Dalton Villarreal has no symptoms of cervical myelopathy.  The symptoms are causing a significant impact on the patient's life.   Review of Systems:  A 10 point review of systems is negative, except for the pertinent positives and negatives detailed in the HPI.  Past Medical History: Past Medical History:  Diagnosis Date   Cancer (HCC)    colon cancer   Diabetes mellitus without complication (HCC)    Hypercholesteremia    Hypertension    Ruptured lumbar disc     Past Surgical History: Past Surgical History:  Procedure Laterality Date   COLON SURGERY     COLONOSCOPY      COLONOSCOPY WITH PROPOFOL  N/A 04/21/2019   Procedure: COLONOSCOPY WITH PROPOFOL ;  Surgeon: [name], MD;  Location: [facility] ENDOSCOPY;  Service: Gastroenterology;  Laterality: N/A;   COLONOSCOPY WITH PROPOFOL  N/A 05/06/2020   Procedure: COLONOSCOPY WITH PROPOFOL ;  Surgeon: [name], MD;  Location: [facility] ENDOSCOPY;  Service: Gastroenterology;  Laterality: N/A;   COLONOSCOPY WITH PROPOFOL  N/A 08/28/2023   Procedure: COLONOSCOPY WITH PROPOFOL ;  Surgeon: [name], MD;  Location: [facility] ENDOSCOPY;  Service: Gastroenterology;  Laterality: N/A;   EYE SURGERY Bilateral    cataracts bilaterally   HOLEP-LASER ENUCLEATION OF THE PROSTATE WITH MORCELLATION N/A 11/18/2020   Procedure: HOLEP-LASER ENUCLEATION OF THE PROSTATE WITH MORCELLATION;  Surgeon: [name], MD;  Location: [facility] ORS;  Service: Urology;  Laterality: N/A;   LAPAROSCOPIC RIGHT COLECTOMY N/A 05/26/2019   Procedure: LAPAROSCOPIC RIGHT COLECTOMY;  Surgeon: [name], MD;  Location: [facility] ORS;  Service: General;  Laterality: N/A;   LUMBAR LAMINECTOMY  2015   x 2   UPPER GI ENDOSCOPY      Allergies: Allergies as of 05/20/2024   (No Known Allergies)    Medications: Outpatient Encounter Medications as of 05/20/2024  Medication Sig   atorvastatin (LIPITOR) 40 MG tablet Take by mouth.   cyclobenzaprine  (FLEXERIL ) 10 MG tablet Take 5-10 mg by mouth every 8 (eight) hours as needed.   enalapril (VASOTEC) 20 MG tablet Take 20 mg by mouth 2 (two) times daily.    hydrochlorothiazide  (HYDRODIURIL ) 25 MG tablet Take 25 mg by mouth daily.    metFORMIN (GLUCOPHAGE-XR) 500 MG 24 hr tablet Take 500 mg  by mouth daily with breakfast.    metoprolol  succinate (TOPROL -XL) 50 MG 24 hr tablet Take 50 mg by mouth daily. Take with or immediately following a meal.   omeprazole (PRILOSEC) 20 MG capsule Take 20 mg by mouth daily at 2 PM.   potassium chloride (KLOR-CON) 8 MEQ tablet Take 8 mEq by mouth daily.    ASPIRIN LOW DOSE 81 MG EC tablet  Take 81 mg by mouth daily.  (Patient not taking: Reported on 05/20/2024)   [DISCONTINUED] polyethylene glycol-electrolytes (NULYTELY) 420 g solution Fill Nulytely prep to the fill line with clear liquid.  Mix well. Drink 8 oz every 15-20 mins until half has been completed. Then, 5 hours before your procedure continue drinking the remainder of Nulytely prep. Drinking 8 oz every 15-20 mins until entire contents have been completed.  Be sure to finish within 2 hours of procedure.   No facility-administered encounter medications on file as of 05/20/2024.    Social History: Social History   Tobacco Use   Smoking status: Never   Smokeless tobacco: Never  Vaping Use   Vaping status: Never Used  Substance Use Topics   Alcohol use: Not Currently   Drug use: Never    Family Medical History: Family History  Problem Relation Age of Onset   Breast cancer Mother    Prostate cancer Brother     Physical Examination: @VITALWITHPAIN @  General: Patient is well developed, well nourished, calm, collected, and in no apparent distress. Attention to examination is appropriate.  Psychiatric: Patient is non-anxious.  Head:  Pupils equal, round, and reactive to light.  ENT:  Oral mucosa appears well hydrated.  Neck:   Supple.  Full range of motion.  Respiratory: Patient is breathing without any difficulty.  Extremities: No edema.  Vascular: Palpable dorsal pedal pulses.  Skin:   On exposed skin, there are no abnormal skin lesions.  NEUROLOGICAL:     Awake, alert, oriented to person, place, and time.  Speech is clear and fluent. Fund of knowledge is appropriate.   Cranial Nerves: Pupils equal round and reactive to light.  Facial tone is symmetric.   ROM of spine: Some tenderness to palpation to lumbar paraspinals on the right side.  No SLR   Strength:  Side Iliopsoas Quads Hamstring PF DF EHL  R 5 5 5 5 5  4/4+  L 5 5 5 5 5 5      Absent patella and achilles reflex No clonus.  Some  difficulty with tandem gait. Clonus is not present.  Toes are down-going.  Bilateral upper and lower extremity sensation is intact to light touch.      Medical Decision Making  Imaging: EXAM: MRI LUMBAR SPINE WITHOUT AND WITH CONTRAST   TECHNIQUE: Multiplanar and multiecho pulse sequences of the lumbar spine were obtained without and with intravenous contrast.   CONTRAST:  7.5mL GADAVIST  GADOBUTROL  1 MMOL/ML IV SOLN   COMPARISON:  07/18/2015   FINDINGS: Segmentation: 5 lumbar type vertebral bodies as numbered previously.   Alignment: Development of 4 mm of degenerative anterolisthesis at L4-5.   Vertebrae:  No fracture or primary bone lesion.   Conus medullaris and cauda equina: Conus extends to the L1 level. Conus and cauda equina appear normal.   Paraspinal and other soft tissues: Distended bladder. Left renal cyst.   Disc levels:   No abnormality at L2-3 or above.   L3-4: Disc degeneration with shallow disc protrusion. Facet and ligamentous hypertrophy. Stenosis of lateral recesses that could cause neural compression  on either or both sides. Worsening since 2016.   L4-5: Advanced bilateral facet arthropathy now with 4 mm of anterolisthesis. Broad-based disc herniation. Previous posterior decompression. Stenosis of the lateral recesses and neural foramina that could cause neural compression on either or both sides.   L5-S1: Previous posterior decompression. Disc space narrowing. Endplate osteophytes and bulging of the disc. Facet hypertrophy. Stenosis of both subarticular lateral recesses that could cause neural compression on either or both sides. Bilateral foraminal narrowing as well. 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   IMPRESSION: L3-4: Shallow disc protrusion. Facet and ligamentous hypertrophy. Stenosis of both lateral recesses that could be symptomatic. Worsened since 2016.   L4-5: Previous posterior decompression and discectomy. Worsened facet  arthropathy with anterolisthesis 4 mm presently. Broad-based disc protrusion. Stenosis of the lateral recesses and neural foramina likely to cause neural compression.   L5-S1: Previous posterior decompression and discectomy. Endplate osteophytes and bulging of the disc. Stenosis of the lateral recesses and neural foramina that could be symptomatic. Arachnoiditis pattern also noted at this level.    I have personally reviewed the images and agree with the above interpretation.  Assessment and Plan: Dalton Villarreal is a pleasant 78 y.o. male with known lumbar spondylolisthesis previous bilateral L4-5 hemilaminectomy and right sided L5-S1 microdiscectomy comes today for increased back and right lower extremity pain.  This has become worse over the past month.  He feels as though he is also becoming weaker and his numbness has become more constant.  Concern for right sided L5 radiculopathy.  Plan includes the following:  -X-rays today to include flexion extension - MRI of lumbar spine - Referral to physical therapy - See back in approximately 8 weeks - Red flag symptoms reviewed with the patient.  Encouraged to reach out to me if there are any changes or if he has any questions or concerns in the future.    Thank you for involving me in the care of this patient.     [name], PA-C Dept. of Neurosurgery

## 2024-05-21 ENCOUNTER — Ambulatory Visit: Payer: Self-pay

## 2024-06-01 ENCOUNTER — Encounter: Payer: Self-pay | Admitting: Physician Assistant

## 2024-06-11 ENCOUNTER — Ambulatory Visit
Admission: RE | Admit: 2024-06-11 | Discharge: 2024-06-11 | Disposition: A | Discharge status: Home / Self Care | Source: Ambulatory Visit | Attending: Physician Assistant | Admitting: Physician Assistant

## 2024-06-11 DIAGNOSIS — M48062 Spinal stenosis, lumbar region with neurogenic claudication: Secondary | ICD-10-CM

## 2024-06-11 DIAGNOSIS — R531 Weakness: Secondary | ICD-10-CM

## 2024-06-17 ENCOUNTER — Ambulatory Visit: Payer: Self-pay | Admitting: Physician Assistant

## 2024-06-22 ENCOUNTER — Ambulatory Visit: Admitting: Physician Assistant

## 2024-06-22 DIAGNOSIS — M549 Dorsalgia, unspecified: Secondary | ICD-10-CM

## 2024-06-22 DIAGNOSIS — M48062 Spinal stenosis, lumbar region with neurogenic claudication: Secondary | ICD-10-CM

## 2024-06-22 NOTE — Progress Notes (Signed)
 CLINICAL DATA:  Chronic right low back and right lower extremity pain. History of prior lumbar surgery.   EXAM: MRI LUMBAR SPINE WITHOUT CONTRAST   TECHNIQUE: Multiplanar, multisequence MR imaging of the lumbar spine was performed. No intravenous contrast was administered.   COMPARISON:  Plain films lumbar spine 05/20/2024. MRI lumbar spine 07/22/2019.   FINDINGS: Segmentation:  Standard.   Alignment: The L5 vertebral body is retrolisthesed 0.5 cm relative to L4 and S1.   Vertebrae:  No fracture, evidence of discitis, or bone lesion.   Conus medullaris and cauda equina: Conus extends to the L1-2 level. Conus and cauda equina appear normal.   Paraspinal and other soft tissues: Left renal cyst is unchanged.   Disc levels:   T11-12 is imaged in the sagittal plane only and unremarkable in appearance.   T12-L1: Negative.   L1-2: Negative.   L2-3: Mild facet degenerative disease.  Otherwise negative.   L3-4: There is moderate facet arthropathy and ligamentum flavum thickening. The patient has a broad-based disc bulge with more focally protruding disc in the right lateral recess and foramen. There is moderate to moderately severe central canal stenosis. Right lateral recess and foraminal narrowing have worsened. Narrowing in the left lateral recess and foramen appear similar to the prior exam.   L4-5: Status post posterior decompression. The disc is uncovered with a diffuse broad-based bulge. Severe bilateral foraminal narrowing appears slightly worse. There is moderate central canal stenosis and narrowing of both lateral recesses. The appearance is unchanged   L5-S1: Status post posterior decompression. Right worse than left lateral recess narrowing due to bulging disc and endplate spur are again seen. There is also bilateral foraminal narrowing. 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   IMPRESSION: 1. Spondylosis appears worse  at L3-4 where there is moderate to moderately severe central canal stenosis. In particular, right lateral recess and foraminal narrowing at L3-4 have worsened since the prior exam. 2. Status post posterior decompression at L4-5. Severe bilateral foraminal narrowing at L4-5 appears slightly worse. Moderate central canal stenosis and narrowing of both lateral recesses at L4-5 appear unchanged. 3. Status post posterior decompression at L5-S1. Right worse than left lateral recess narrowing and bilateral foraminal narrowing at L5-S1 appear unchanged. 4. 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     Spoke with patient over the phone regarding MRI results.  Of note I had difficulty hearing the patient and had to call multiple times.  We did go over his MRI results.  He states that he does not have any new weakness, but continues to have back pain.  We spoke about different options and he would like to undergo physical therapy and injections if possible.  I had previously sent in a physical therapy referral, but went ahead and sent in a referral for injections as well.  Plan to see back in approximately 8 weeks.  Red flag symptoms reviewed with the patient and when she should contact us .  This visit was performed via telephone.  Patient location: home Provider location: office  I spent a total of 10  minutes non-face-to-face activities for this visit on the date of this encounter including review of current clinical condition and response to treatment.  The patient is aware of and accepts the limits of this telehealth visit.

## 2024-07-08 ENCOUNTER — Encounter: Payer: Self-pay | Admitting: Rehabilitative and Restorative Service Providers"

## 2024-07-08 ENCOUNTER — Ambulatory Visit: Admitting: Rehabilitative and Restorative Service Providers"

## 2024-07-08 DIAGNOSIS — M5416 Radiculopathy, lumbar region: Secondary | ICD-10-CM

## 2024-07-08 DIAGNOSIS — R293 Abnormal posture: Secondary | ICD-10-CM | POA: Diagnosis not present

## 2024-07-08 DIAGNOSIS — R262 Difficulty in walking, not elsewhere classified: Secondary | ICD-10-CM | POA: Diagnosis not present

## 2024-07-08 DIAGNOSIS — M5459 Other low back pain: Secondary | ICD-10-CM | POA: Diagnosis not present

## 2024-07-08 DIAGNOSIS — M6281 Muscle weakness (generalized): Secondary | ICD-10-CM

## 2024-07-08 NOTE — Therapy (Signed)
 OUTPATIENT PHYSICAL THERAPY THORACOLUMBAR EVALUATION Date of referral: 05/20/2024 Referring provider: [name], PA-C Referring diagnosis?  M48.062 (ICD-10-CM) - Spinal stenosis, lumbar region, with neurogenic claudication   Treatment diagnosis? (if different than referring diagnosis) M54.59   M54.16   R26.2   R29.3   M62.81  What was this (referring dx) caused by? Ongoing Issue and Arthritis  Dalton [facility] of Condition: Chronic (continuous duration > 3 months)   Laterality: Rt  Current Functional Measure Score: Patient Specific Functional Scale 2  Objective measurements identify impairments when they are compared to normal values, the uninvolved extremity, and prior level of function.  [x]  Yes  []  No  Objective assessment of functional ability: Severe functional limitations   Briefly describe symptoms: Low back pain and right sided radiculopathy that limit standing, walking and sleeping  How did symptoms start: Gradual onset over several months  Average pain intensity:  Last 24 hours: 0-10/10  Past week: 0-10/10  How often does the pt experience symptoms? Frequently  How much have the symptoms interfered with usual daily activities? Quite a bit  How has condition changed since care began at this facility? NA - initial visit  In general, how is the patients overall health? Fair   BACK PAIN (STarT Back Screening Tool) Has pain spread down the leg(s) at some time in the last 2 weeks? Yes Has there been pain in the shoulder or neck at some time in the last 2 weeks? No Has the pt only walked short distances because of back pain? Yes Has patient dressed more slowly because of back pain in the past 2 weeks? Yes Does patient think it's not safe for a person with this condition to be physically active? No Does patient have worrying thoughts a lot of the time? No Does patient feel back pain is terrible and will never get any better? No answer Has patient stopped enjoying things  they usually enjoy? Yes   Patient Name: Dalton [facility] MRN: [id number] DOB:07-29-1946, 78 y.o., male Today's Date: 07/08/2024  END OF SESSION:  PT End of Session - 07/08/24 1142     Visit Number 1    Number of Visits 16    Date for Recertification  09/02/24    Authorization Type [employer] Medicare    Progress Note Due on Visit 10    PT Start Time 0851    PT Stop Time 0933    PT Time Calculation (min) 42 min    Activity Tolerance Patient tolerated treatment well;No increased pain;Patient limited by pain    Behavior During Therapy Dalton [facility] for tasks assessed/performed          Past Medical History:  Diagnosis Date   Cancer (Dalton Villarreal)    colon cancer   Diabetes mellitus without complication (Dalton Vill[facility])    Hypercholesteremia    Hypertension    Ruptured lumbar disc    Past Surgical History:  Procedure Laterality Date   COLON SURGERY     COLONOSCOPY     COLONOSCOPY WITH PROPOFOL  N/A 04/21/2019   Procedure: COLONOSCOPY WITH PROPOFOL ;  Surgeon: Dalton Bi, MD;  Location[facility] ENDOSCOPY;  Service: Gastroenterology;  Laterality: N/A;   COLONOSCOPY WITH PROPOFOL  N/A 05/06/2020   Procedure: COLONOSCOPY WITH PROPOFOL ;  Surgeon: Dalton Bi, MD;  Location: [facility] ENDOSCOPY;  Service: Gastroenterology;  Laterality: N/A;   COLONOSCOPY WITH PROPOFOL  N/A 08/28/2023   Procedure: COLONOSCOPY WITH PROPOFOL ;  Surgeon: Dalton Bi, MD;  Location: [facility] ENDOSCOPY;  Service: Gastroenterology;  Laterality: N/A;   EYE SURGERY Bilateral  cataracts bilaterally   HOLEP-LASER ENUCLEATION OF THE PROSTATE WITH MORCELLATION N/A 11/18/2020   Procedure: HOLEP-LASER ENUCLEATION OF THE PROSTATE WITH MORCELLATION;  Surgeon: Dalton Redell BROCKS, MD;  Location: [facility] ORS;  Service: Urology;  Laterality: N/A;   LAPAROSCOPIC RIGHT COLECTOMY N/A 05/26/2019   Procedure: LAPAROSCOPIC RIGHT COLECTOMY;  Surgeon: Dalton Laneta FALCON, MD;  Location: [facility] ORS;  Service: General;  Laterality: N/A;   LUMBAR LAMINECTOMY  2015   x 2   UPPER GI  ENDOSCOPY     Patient Active Problem List   Diagnosis Date Noted   History of colon cancer 08/28/2023   Lumbar post-laminectomy syndrome 03/22/2020   Spinal stenosis, lumbar region, with neurogenic claudication 03/22/2020   Lumbar facet arthropathy 03/22/2020   Chronic radicular lumbar pain 03/22/2020   Lumbar degenerative disc disease 03/22/2020   Chronic pain syndrome 03/22/2020   Family history of cancer 06/13/2019   Colon cancer (Dalton Vill[facility]) 05/26/2019   Cancer (Dalton [facility])    Malignant neoplasm of colon (Dalton [facility]) 05/01/2019    PCP: Dalton DELENA Buckles, MD  REFERRING PROVIDER: [name], PA-C  REFERRING DIAG:  [phone] (ICD-10-CM) - Spinal stenosis, lumbar region, with neurogenic claudication    Rationale for Evaluation and Treatment: Rehabilitation  THERAPY DIAG:  Other low back pain  Radiculopathy, lumbar region  Difficulty in walking, not elsewhere classified  Abnormal posture  Muscle weakness (generalized)  ONSET DATE: Getting worse over the past several months  SUBJECTIVE:                                                                                                                                                                                           SUBJECTIVE STATEMENT: Dalton [facility] notes Rt lower extremity tingling as distal as the toes.  Dalton [facility] can't stand for more than 10 minutes before being stopped by low back pain.  Walking is also limited to no more than 5 minutes due to low back pain.  He is comfortable sitting.  Symptoms are limiting his ability to do normal ADLs in a reasonable amount of time.  PERTINENT HISTORY:  Colon cancer, Type 2 DM, HTN, 2 previous low back surgeries (laminectomy L4-5 and L5-S1), spinal stenosis, lumbar OA/DDD/DJD  PAIN:  Are you having pain? Yes: NPRS scale: 0-10/10 this week Pain location: Low back and right leg Pain description: Muscle spasm, can be sharp, leg is tingling Aggravating factors: Standing, sleeping, walking > 5  minutes Relieving factors: Sitting  PRECAUTIONS: Other: 2 previous lumbar laminectomies  RED FLAGS: None   WEIGHT BEARING RESTRICTIONS: No  FALLS:  Has patient fallen in last 6 months? Yes. 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  DIAGNOSTIC FINDINGS:  IMPRESSION: 1. Spondylosis appears worse at L3-4 where there is moderate to moderately severe central canal stenosis. In particular, right lateral recess and foraminal narrowing at L3-4 have worsened since the prior exam. 2. Status post posterior decompression at L4-5. Severe bilateral foraminal narrowing at L4-5 appears slightly worse. Moderate central canal stenosis and narrowing of both lateral recesses at L4-5 appear unchanged. 3. Status post posterior decompression at L5-S1. Right worse than left lateral recess narrowing and bilateral foraminal narrowing at L5-S1 appear unchanged. 4. 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  PATIENT SURVEYS:  PSFS: THE PATIENT SPECIFIC FUNCTIONAL SCALE  Place score of 0-10 (0 = unable to perform activity and 10 = able to perform activity at the same level as before injury or problem)  Activity Date: 07/08/2024    Standing 2    2.   Walking 1    3.   Sleeping 3    4.      Total Score 2      Total Score = Sum of activity scores/number of activities  Minimally Detectable Change: 3 points (for single activity); 2 points (for average score)  [facility] (nd). The  Patient Specific Functional Scale . Retrieved from [email or web address]   COGNITION: Overall cognitive status: Within functional limits for tasks assessed     SENSATION: Clem notes Rt leg peripheral symptoms (mostly tingling) as distal as the toes, primarlity with standing, walking and sleeping  MUSCLE LENGTH: Hamstrings: Right 30 deg; Left 30 deg  POSTURE: rounded shoulders, forward head, decreased lumbar lordosis, and flexed trunk    LUMBAR ROM:   AROM 07/08/2024  Flexion   Extension 0  Right lateral flexion   Left lateral flexion   Right rotation   Left rotation    (Blank rows = not tested)  LOWER EXTREMITY ROM:     Active  Left/Right 07/08/2024   Hip flexion 75/75   Hip extension    Hip abduction    Hip adduction    Hip internal rotation    Hip external rotation    Knee flexion    Knee extension    Ankle dorsiflexion    Ankle plantarflexion    Ankle inversion    Ankle eversion     (Blank rows = not tested)  STRENGTH:  Not objectively assessed at evaluation secondary to patient arriving late and needing time for education and HEP.  MMT Left/Right    Hip flexion    Hip extension    Hip abduction    Hip adduction    Hip internal rotation    Hip external rotation    Knee flexion    Knee extension    Ankle dorsiflexion    Ankle plantarflexion    Ankle inversion    Ankle eversion     (Blank rows = not tested)  GAIT: Distance walked: 50 feet Assistive device utilized: None Level of assistance: Complete Independence Comments: Saifan notes his walking is limited to no more than 5 minutes  due to increasing low back pain  TREATMENT DATE: 07/08/2024 Lumbar extension active range of motion 10 x 3 seconds, hands low on gluteals and hips forward Supine hamstrings stretch with the opposite leg straight 4 x 20 seconds Single knee-to-chest stretch with the opposite leg straight 4 x 20  seconds  02464: Reviewed imaging with spine model; reviewed examination findings and day 1 home exercises                                                                                                                                PATIENT EDUCATION:  Education details: See above Person educated: Patient Education method: Explanation, Demonstration, Tactile cues, Verbal cues, and Handouts Education comprehension: verbalized understanding, returned demonstration, verbal cues required, tactile cues required, and needs further education  HOME EXERCISE PROGRAM: Access Code: [email or web address] URL: [email or web address] Date: 07/08/2024 Prepared by: Lamar Ivory  Exercises - Standing Lumbar Extension at Wall - Forearms  - 5 x daily - 7 x weekly - 1 sets - 5 reps - 3 seconds hold - Single Knee to Chest Stretch  - 2-3 x daily - 7 x weekly - 1 sets - 5 reps - 20 seconds hold - Supine Hamstring Stretch  - 2-3 x daily - 7 x weekly - 1 sets - 5 reps - 20 seconds hold  ASSESSMENT:  CLINICAL IMPRESSION: Patient is a 78 y.o. male who was seen today for physical therapy evaluation and treatment for  M48.062 (ICD-10-CM) - Spinal stenosis, lumbar region, with neurogenic claudication  .  Zacharey has a very flexed posture, has tight hip musculature contributing to his flexed posture in standing and walking, has limited endurance with standing and walking due to increasing low back pain and right lower extremity radiculopathy.  He will benefit from supervised physical therapy to improve his standing posture, practical body mechanics, bilateral lower extremity flexibility (particularly hip flexors and hamstrings) and low back strength to improve his weight-bearing function.  Gergory also  Consider Epley as Nyzir suffers from vertigo which may affect his participation with some activities.  OBJECTIVE IMPAIRMENTS: Abnormal gait, decreased activity tolerance, decreased endurance, decreased knowledge of  condition, difficulty walking, decreased ROM, decreased strength, decreased safety awareness, impaired perceived functional ability, increased muscle spasms, impaired flexibility, improper body mechanics, postural dysfunction, obesity, and pain.   ACTIVITY LIMITATIONS: carrying, lifting, bending, standing, squatting, sleeping, stairs, bed mobility, and locomotion level  PARTICIPATION LIMITATIONS: meal prep, cleaning, shopping, community activity, and occupation  PERSONAL FACTORS: Colon cancer, Type 2 DM, HTN, 2 previous low back surgeries (laminectomy L4-5 and L5-S1), spinal stenosis, lumbar OA/DDD/DJD are also affecting patient's functional outcome.   REHAB POTENTIAL: Good  CLINICAL DECISION MAKING: Evolving/moderate complexity  EVALUATION COMPLEXITY: Moderate   GOALS: Goals reviewed with patient? Yes  SHORT TERM GOALS: Target date: 08/05/2024  Bruce will be independent in his day 1 home exercise program Baseline: Started 07/08/2024 Goal status: INITIAL  2.  Improve lumbar extension AROM to  at least 5 degrees Baseline: 0 degrees Goal status: INITIAL  3.  Improve bilateral lower extremity flexibility for hip flexors to 90 degrees and hamstrings to 40 degrees Baseline: 75 and 30 respectively Goal status: INITIAL   LONG TERM GOALS: Target date: 09/07/2024  Improve patient-specific functional score to at least 5 Baseline: 2 Goal status: INITIAL  2.  Cody will report low back and right lower extremity radicular pain no higher than 4/10 on the visual analog scale Baseline: Can be 10/10 Goal status: INITIAL  3.  Improve lumbar extension AROM to at least 10 degrees Baseline: 0 degrees Goal status: INITIAL  4.  Julia will report being able to stand for up to 20 minutes without being stopped by increasing low back pain Baseline: 10 minutes Goal status: INITIAL  5.  Arush will be able to walk for up to 10 minutes without being stopped by increasing low back  pain Baseline: No more than 5 minutes Goal status: INITIAL  6.  Sahib will be independent with his long-term maintenance home exercise program at discharge Baseline: Started 07/08/2024 Goal status: INITIAL  PLAN:  PT FREQUENCY: 1-2x/week  PT DURATION: 8 weeks  PLANNED INTERVENTIONS: 97110-Therapeutic exercises, 97530- Therapeutic activity, 97112- Neuromuscular re-education, 97535- Self Care, [id number]- Manual therapy, [id number]- Gait training, [phone]- Traction (mechanical), [phone] (1-2 muscles), [id number] (3+ muscles)- Dry Needling, Patient/Family education, Balance training, Stair training, Spinal mobilization, Cryotherapy, and Moist heat.  PLAN FOR NEXT SESSION: Review day 1 home exercises.  Special emphasis on posture and body mechanics awareness along with flexibility for hip flexors, hamstrings and strengthening for scapular retractors and low back to improve standing and walking endurance.   Myer LELON Ivory, PT, MPT 07/08/2024, 11:43 AM

## 2024-07-08 NOTE — Therapy (Incomplete)
 OUTPATIENT PHYSICAL THERAPY THORACOLUMBAR TREATMENT Date of referral: 05/20/2024 Referring provider: [name], PA-C Referring diagnosis?  M48.062 (ICD-10-CM) - Spinal stenosis, lumbar region, with neurogenic claudication   Treatment diagnosis? (if different than referring diagnosis) M54.59   M54.16   R26.2   R29.3   M62.81  What was this (referring dx) caused by? Ongoing Issue and Arthritis  [name] of Condition: Chronic (continuous duration > 3 months)   Laterality: Rt  Current Functional Measure Score: Patient Specific Functional Scale 2  Objective measurements identify impairments when they are compared to normal values, the uninvolved extremity, and prior level of function.  [x]  Yes  []  No  Objective assessment of functional ability: Severe functional limitations   Briefly describe symptoms: Low back pain and right sided radiculopathy that limit standing, walking and sleeping  How did symptoms start: Gradual onset over several months  Average pain intensity:  Last 24 hours: 0-10/10  Past week: 0-10/10  How often does the pt experience symptoms? Frequently  How much have the symptoms interfered with usual daily activities? Quite a bit  How has condition changed since care began at this facility? NA - initial visit  In general, how is the patients overall health? Fair   BACK PAIN (STarT Back Screening Tool) Has pain spread down the leg(s) at some time in the last 2 weeks? Yes Has there been pain in the shoulder or neck at some time in the last 2 weeks? No Has the pt only walked short distances because of back pain? Yes Has patient dressed more slowly because of back pain in the past 2 weeks? Yes Does patient think it's not safe for a person with this condition to be physically active? No Does patient have worrying thoughts a lot of the time? No Does patient feel back pain is terrible and will never get any better? No answer Has patient stopped enjoying things  they usually enjoy? Yes   Patient Name: Dalton Villarreal MRN: [id number] DOB:Nov 25, 1945, 78 y.o., male Today's Date: 07/08/2024  END OF SESSION:    Past Medical History:  Diagnosis Date   Cancer (HCC)    colon cancer   Diabetes mellitus without complication (HCC)    Hypercholesteremia    Hypertension    Ruptured lumbar disc    Past Surgical History:  Procedure Laterality Date   COLON SURGERY     COLONOSCOPY     COLONOSCOPY WITH PROPOFOL  N/A 04/21/2019   Procedure: COLONOSCOPY WITH PROPOFOL ;  Surgeon: Dalton Bi, MD;  Location: [facility] ENDOSCOPY;  Service: Gastroenterology;  Laterality: N/A;   COLONOSCOPY WITH PROPOFOL  N/A 05/06/2020   Procedure: COLONOSCOPY WITH PROPOFOL ;  Surgeon: Dalton Bi, MD;  Location: [facility] ENDOSCOPY;  Service: Gastroenterology;  Laterality: N/A;   COLONOSCOPY WITH PROPOFOL  N/A 08/28/2023   Procedure: COLONOSCOPY WITH PROPOFOL ;  Surgeon: Dalton Bi, MD;  Location: [facility] ENDOSCOPY;  Service: Gastroenterology;  Laterality: N/A;   EYE SURGERY Bilateral    cataracts bilaterally   HOLEP-LASER ENUCLEATION OF THE PROSTATE WITH MORCELLATION N/A 11/18/2020   Procedure: HOLEP-LASER ENUCLEATION OF THE PROSTATE WITH MORCELLATION;  Surgeon: Dalton Redell BROCKS, MD;  Location: [facility] ORS;  Service: Urology;  Laterality: N/A;   LAPAROSCOPIC RIGHT COLECTOMY N/A 05/26/2019   Procedure: LAPAROSCOPIC RIGHT COLECTOMY;  Surgeon: Dalton Laneta FALCON, MD;  Location: [facility] ORS;  Service: General;  Laterality: N/A;   LUMBAR LAMINECTOMY  2015   x 2   UPPER GI ENDOSCOPY     Patient Active Problem List   Diagnosis Date Noted  History of colon cancer 08/28/2023   Lumbar post-laminectomy syndrome 03/22/2020   Spinal stenosis, lumbar region, with neurogenic claudication 03/22/2020   Lumbar facet arthropathy 03/22/2020   Chronic radicular lumbar pain 03/22/2020   Lumbar degenerative disc disease 03/22/2020   Chronic pain syndrome 03/22/2020   Family history of cancer 06/13/2019   Colon  cancer (HCC) 05/26/2019   Cancer (HCC)    Malignant neoplasm of colon (HCC) 05/01/2019    PCP: Dalton DELENA Buckles, MD  REFERRING PROVIDER: [name], PA-C  REFERRING DIAG:  [phone] (ICD-10-CM) - Spinal stenosis, lumbar region, with neurogenic claudication    Rationale for Evaluation and Treatment: Rehabilitation  THERAPY DIAG:  No diagnosis found.  ONSET DATE: Getting worse over the past several months  SUBJECTIVE:                                                                                                                                                                                           SUBJECTIVE STATEMENT: ***Dalton Villarreal notes Rt lower extremity tingling as distal as the toes.  Dalton Villarreal can't stand for more than 10 minutes before being stopped by low back pain.  Walking is also limited to no more than 5 minutes due to low back pain.  He is comfortable sitting.  Symptoms are limiting his ability to do normal ADLs in a reasonable amount of time.  PERTINENT HISTORY:  Colon cancer, Type 2 DM, HTN, 2 previous low back surgeries (laminectomy L4-5 and L5-S1), spinal stenosis, lumbar OA/DDD/DJD  PAIN:  ***Are you having pain? Yes: NPRS scale: 0-10/10 this week Pain location: Low back and right leg Pain description: Muscle spasm, can be sharp, leg is tingling Aggravating factors: Standing, sleeping, walking > 5 minutes Relieving factors: Sitting  PRECAUTIONS: Other: 2 previous lumbar laminectomies  RED FLAGS: None   WEIGHT BEARING RESTRICTIONS: No  FALLS:  Has patient fallen in last 6 months? Yes. 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  DIAGNOSTIC FINDINGS:  IMPRESSION: 1. Spondylosis appears worse at L3-4 where there is moderate to moderately severe central canal stenosis. In particular, right lateral recess and foraminal narrowing at L3-4 have worsened since the prior exam. 2. Status  post posterior decompression at L4-5. Severe bilateral foraminal narrowing at L4-5 appears slightly worse. Moderate central canal stenosis and narrowing of both lateral recesses at L4-5 appear unchanged. 3. Status post posterior decompression at L5-S1. Right worse than left lateral recess narrowing and bilateral foraminal narrowing at L5-S1 appear unchanged. 4. 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  PATIENT SURVEYS:  PSFS: THE PATIENT SPECIFIC FUNCTIONAL SCALE  Place score of 0-10 (0 = unable to perform activity and 10 = able to perform activity at the same level as before injury or problem)  Activity Date: 07/08/2024    Standing 2    2.   Walking 1    3.   Sleeping 3    4.      Total Score 2      Total Score = Sum of activity scores/number of activities  Minimally Detectable Change: 3 points (for single activity); 2 points (for average score)  [facility] (nd). The Patient Specific Functional Scale . Retrieved from [email or web address]   COGNITION: Overall cognitive status: Within functional limits for tasks assessed     SENSATION: Clem notes Rt leg peripheral symptoms (mostly tingling) as distal as the toes, primarlity with standing, walking and sleeping  MUSCLE LENGTH: Hamstrings: Right 30 deg; Left 30 deg  POSTURE: rounded shoulders, forward head, decreased lumbar lordosis, and flexed trunk    LUMBAR ROM:   AROM 07/08/2024  Flexion   Extension 0  Right lateral  flexion   Left lateral flexion   Right rotation   Left rotation    (Blank rows = not tested)  LOWER EXTREMITY ROM:     Active  Left/Right 07/08/2024   Hip flexion 75/75   Hip extension    Hip abduction    Hip adduction    Hip internal rotation    Hip external rotation    Knee flexion    Knee extension    Ankle dorsiflexion    Ankle plantarflexion    Ankle inversion    Ankle eversion     (Blank rows = not tested)  STRENGTH:  Not objectively assessed at evaluation secondary to patient arriving late and needing time for education and HEP.  MMT Left/Right    Hip flexion    Hip extension    Hip abduction    Hip adduction    Hip internal rotation    Hip external rotation    Knee flexion    Knee extension    Ankle dorsiflexion    Ankle plantarflexion    Ankle inversion    Ankle eversion     (Blank rows = not tested)  GAIT: Distance walked: 50 feet Assistive device utilized: None Level of assistance: Complete Independence Comments: Westly notes his walking is limited to no more than 5 minutes due to increasing low back pain  TREATMENT DATE: 07/13/24***     07/08/2024 Lumbar extension active range of motion 10 x 3 seconds, hands low on gluteals and hips forward Supine hamstrings stretch with the opposite leg straight 4 x 20 seconds Single knee-to-chest stretch with the opposite leg straight 4 x 20 seconds  02464: Reviewed imaging with spine model; reviewed examination findings and day 1 home exercises  PATIENT EDUCATION:  Education details: See above Person educated: Patient Education method: Explanation, Demonstration, Tactile cues, Verbal cues, and Handouts Education comprehension: verbalized understanding, returned demonstration, verbal cues required, tactile cues required, and needs further education  HOME EXERCISE PROGRAM: Access  Code: [email or web address] URL: [email or web address] Date: 07/08/2024 Prepared by: Lamar Ivory  Exercises - Standing Lumbar Extension at Wall - Forearms  - 5 x daily - 7 x weekly - 1 sets - 5 reps - 3 seconds hold - Single Knee to Chest Stretch  - 2-3 x daily - 7 x weekly - 1 sets - 5 reps - 20 seconds hold - Supine Hamstring Stretch  - 2-3 x daily - 7 x weekly - 1 sets - 5 reps - 20 seconds hold  ASSESSMENT:  CLINICAL IMPRESSION: ***Patient is a 78 y.o. male who was seen today for physical therapy evaluation and treatment for  M48.062 (ICD-10-CM) - Spinal stenosis, lumbar region, with neurogenic claudication  .  Dashawn has a very flexed posture, has tight hip musculature contributing to his flexed posture in standing and walking, has limited endurance with standing and walking due to increasing low back pain and right lower extremity radiculopathy.  He will benefit from supervised physical therapy to improve his standing posture, practical body mechanics, bilateral lower extremity flexibility (particularly hip flexors and hamstrings) and low back strength to improve his weight-bearing function.  Ashaun also  Consider [place]y as Rod suffers from vertigo which may affect his participation with some activities.  OBJECTIVE IMPAIRMENTS: Abnormal gait, decreased activity tolerance, decreased endurance, decreased knowledge of condition, difficulty walking, decreased ROM, decreased strength, decreased safety awareness, impaired perceived functional ability, increased muscle spasms, impaired flexibility, improper body mechanics, postural dysfunction, obesity, and pain.   ACTIVITY LIMITATIONS: carrying, lifting, bending, standing, squatting, sleeping, stairs, bed mobility, and locomotion level  PARTICIPATION LIMITATIONS: meal prep, cleaning, shopping, community activity, and occupation  PERSONAL FACTORS: Colon cancer, Type 2 DM, HTN, 2 previous low back surgeries (laminectomy L4-5 and L5-S1),  spinal stenosis, lumbar OA/DDD/DJD are also affecting patient's functional outcome.   REHAB POTENTIAL: Good  CLINICAL DECISION MAKING: Evolving/moderate complexity  EVALUATION COMPLEXITY: Moderate   GOALS: Goals reviewed with patient? Yes  SHORT TERM GOALS: Target date: 08/05/2024  Terrance will be independent in his day 1 home exercise program Baseline: Started 07/08/2024 Goal status: INITIAL  2.  Improve lumbar extension AROM to at least 5 degrees Baseline: 0 degrees Goal status: INITIAL  3.  Improve bilateral lower extremity flexibility for hip flexors to 90 degrees and hamstrings to 40 degrees Baseline: 75 and 30 respectively Goal status: INITIAL   LONG TERM GOALS: Target date: 09/07/2024  Improve patient-specific functional score to at least 5 Baseline: 2 Goal status: INITIAL  2.  Elieser will report low back and right lower extremity radicular pain no higher than 4/10 on the visual analog scale Baseline: Can be 10/10 Goal status: INITIAL  3.  Improve lumbar extension AROM to at least 10 degrees Baseline: 0 degrees Goal status: INITIAL  4.  Clary will report being able to stand for up to 20 minutes without being stopped by increasing low back pain Baseline: 10 minutes Goal status: INITIAL  5.  Tavaras will be able to walk for up to 10 minutes without being stopped by increasing low back pain Baseline: No more than 5 minutes Goal status: INITIAL  6.  Lamel will be independent with his long-term maintenance home exercise program at discharge Baseline: Started 07/08/2024 Goal status: INITIAL  PLAN:  PT  FREQUENCY: 1-2x/week  PT DURATION: 8 weeks  PLANNED INTERVENTIONS: 97110-Therapeutic exercises, 97530- Therapeutic activity, [id number]- Neuromuscular re-education, 97535- Self Care, [id number]- Manual therapy, [id number]- Gait training, [phone]- Traction (mechanical), [phone] (1-2 muscles), [id number] (3+ muscles)- Dry Needling, Patient/Family education, Balance training, Stair  training, Spinal mobilization, Cryotherapy, and Moist heat.  PLAN FOR NEXT SESSION: ***Review day 1 home exercises.  Special emphasis on posture and body mechanics awareness along with flexibility for hip flexors, hamstrings and strengthening for scapular retractors and low back to improve standing and walking endurance.  Burnard Meth, PT 07/08/24  2:18 PM

## 2024-07-13 ENCOUNTER — Encounter

## 2024-07-23 ENCOUNTER — Encounter: Admitting: Rehabilitative and Restorative Service Providers"

## 2024-07-23 ENCOUNTER — Telehealth: Payer: Self-pay | Admitting: Rehabilitative and Restorative Service Providers"

## 2024-07-23 NOTE — Telephone Encounter (Signed)
 Called 2 x.  Both times it sounded like a pick-up but silence on the other end of the line.  Given today's no show and no available email or voicemail, I'll DC this patient if they no show a second time.  I can't hold a spot if there is no way to remind them and they miss multiple appointments.

## 2024-07-29 NOTE — Therapy (Incomplete)
 OUTPATIENT PHYSICAL THERAPY THORACOLUMBAR TREATMENT Date of referral: 05/20/2024 Referring provider: [name], PA-C Referring diagnosis?  M48.062 (ICD-10-CM) - Spinal stenosis, lumbar region, with neurogenic claudication   Treatment diagnosis? (if different than referring diagnosis) M54.59   M54.16   R26.2   R29.3   M62.81  What was this (referring dx) caused by? Ongoing Issue and Arthritis  [name] of Condition: Chronic (continuous duration > 3 months)   Laterality: Rt  Current Functional Measure Score: Patient Specific Functional Scale 2  Objective measurements identify impairments when they are compared to normal values, the uninvolved extremity, and prior level of function.  [x]  Yes  []  No  Objective assessment of functional ability: Severe functional limitations   Briefly describe symptoms: Low back pain and right sided radiculopathy that limit standing, walking and sleeping  How did symptoms start: Gradual onset over several months  Average pain intensity:  Last 24 hours: 0-10/10  Past week: 0-10/10  How often does the pt experience symptoms? Frequently  How much have the symptoms interfered with usual daily activities? Quite a bit  How has condition changed since care began at this facility? NA - initial visit  In general, how is the patients overall health? Fair   BACK PAIN (STarT Back Screening Tool) Has pain spread down the leg(s) at some time in the last 2 weeks? Yes Has there been pain in the shoulder or neck at some time in the last 2 weeks? No Has the pt only walked short distances because of back pain? Yes Has patient dressed more slowly because of back pain in the past 2 weeks? Yes Does patient think it's not safe for a person with this condition to be physically active? No Does patient have worrying thoughts a lot of the time? No Does patient feel back pain is terrible and will never get any better? No answer Has patient stopped enjoying things  they usually enjoy? Yes   Patient Name: Dalton Villarreal MRN: [id number] DOB:1945/12/19, 78 y.o., male Today's Date: 07/29/2024  END OF SESSION:    Past Medical History:  Diagnosis Date   Cancer (HCC)    colon cancer   Diabetes mellitus without complication (HCC)    Hypercholesteremia    Hypertension    Ruptured lumbar disc    Past Surgical History:  Procedure Laterality Date   COLON SURGERY     COLONOSCOPY     COLONOSCOPY WITH PROPOFOL  N/A 04/21/2019   Procedure: COLONOSCOPY WITH PROPOFOL ;  Surgeon: Dalton Bi, MD;  Location: [facility] ENDOSCOPY;  Service: Gastroenterology;  Laterality: N/A;   COLONOSCOPY WITH PROPOFOL  N/A 05/06/2020   Procedure: COLONOSCOPY WITH PROPOFOL ;  Surgeon: Dalton Bi, MD;  Location: [facility] ENDOSCOPY;  Service: Gastroenterology;  Laterality: N/A;   COLONOSCOPY WITH PROPOFOL  N/A 08/28/2023   Procedure: COLONOSCOPY WITH PROPOFOL ;  Surgeon: Dalton Bi, MD;  Location: [facility] ENDOSCOPY;  Service: Gastroenterology;  Laterality: N/A;   EYE SURGERY Bilateral    cataracts bilaterally   HOLEP-LASER ENUCLEATION OF THE PROSTATE WITH MORCELLATION N/A 11/18/2020   Procedure: HOLEP-LASER ENUCLEATION OF THE PROSTATE WITH MORCELLATION;  Surgeon: Dalton Redell BROCKS, MD;  Location: [facility] ORS;  Service: Urology;  Laterality: N/A;   LAPAROSCOPIC RIGHT COLECTOMY N/A 05/26/2019   Procedure: LAPAROSCOPIC RIGHT COLECTOMY;  Surgeon: Dalton Laneta FALCON, MD;  Location: [facility] ORS;  Service: General;  Laterality: N/A;   LUMBAR LAMINECTOMY  2015   x 2   UPPER GI ENDOSCOPY     Patient Active Problem List   Diagnosis Date Noted  History of colon cancer 08/28/2023   Lumbar post-laminectomy syndrome 03/22/2020   Spinal stenosis, lumbar region, with neurogenic claudication 03/22/2020   Lumbar facet arthropathy 03/22/2020   Chronic radicular lumbar pain 03/22/2020   Lumbar degenerative disc disease 03/22/2020   Chronic pain syndrome 03/22/2020   Family history of cancer 06/13/2019   Colon  cancer (HCC) 05/26/2019   Cancer (HCC)    Malignant neoplasm of colon (HCC) 05/01/2019    PCP: Dalton DELENA Buckles, MD  REFERRING PROVIDER: [name], PA-C  REFERRING DIAG:  [phone] (ICD-10-CM) - Spinal stenosis, lumbar region, with neurogenic claudication    Rationale for Evaluation and Treatment: Rehabilitation  THERAPY DIAG:  No diagnosis found.  ONSET DATE: Getting worse over the past several months  SUBJECTIVE:                                                                                                                                                                                           SUBJECTIVE STATEMENT: ***Dalton Villarreal notes Rt lower extremity tingling as distal as the toes.  [name] can't stand for more than 10 minutes before being stopped by low back pain.  Walking is also limited to no more than 5 minutes due to low back pain.  He is comfortable sitting.  Symptoms are limiting his ability to do normal ADLs in a reasonable amount of time.  PERTINENT HISTORY:  Colon cancer, Type 2 DM, HTN, 2 previous low back surgeries (laminectomy L4-5 and L5-S1), spinal stenosis, lumbar OA/DDD/DJD  PAIN:  ***Are you having pain? Yes: NPRS scale: 0-10/10 this week Pain location: Low back and right leg Pain description: Muscle spasm, can be sharp, leg is tingling Aggravating factors: Standing, sleeping, walking > 5 minutes Relieving factors: Sitting  PRECAUTIONS: Other: 2 previous lumbar laminectomies  RED FLAGS: None   WEIGHT BEARING RESTRICTIONS: No  FALLS:  Has patient fallen in last 6 months? Yes. 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  DIAGNOSTIC FINDINGS:  IMPRESSION: 1. Spondylosis appears worse at L3-4 where there is moderate to moderately severe central canal stenosis. In particular, right lateral recess and foraminal narrowing at L3-4 have worsened since the prior exam. 2. Status  post posterior decompression at L4-5. Severe bilateral foraminal narrowing at L4-5 appears slightly worse. Moderate central canal stenosis and narrowing of both lateral recesses at L4-5 appear unchanged. 3. Status post posterior decompression at L5-S1. Right worse than left lateral recess narrowing and bilateral foraminal narrowing at L5-S1 appear unchanged. 4. 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  PATIENT SURVEYS:  PSFS: THE PATIENT SPECIFIC FUNCTIONAL SCALE  Place score of 0-10 (0 = unable to perform activity and 10 = able to perform activity at the same level as before injury or problem)  Activity Date: 07/08/2024    Standing 2    2.   Walking 1    3.   Sleeping 3    4.      Total Score 2      Total Score = Sum of activity scores/number of activities  Minimally Detectable Change: 3 points (for single activity); 2 points (for average score)  [employer] [employer] (nd). The Patient Specific Functional Scale . Retrieved from [email or web address]   COGNITION: Overall cognitive status: Within functional limits for tasks assessed     SENSATION: Clem notes Rt leg peripheral symptoms (mostly tingling) as distal as the toes, primarlity with standing, walking and sleeping  MUSCLE LENGTH: Hamstrings: Right 30 deg; Left 30 deg  POSTURE: rounded shoulders, forward head, decreased lumbar lordosis, and flexed trunk    LUMBAR ROM:   AROM 07/08/2024  Flexion   Extension 0  Right lateral  flexion   Left lateral flexion   Right rotation   Left rotation    (Blank rows = not tested)  LOWER EXTREMITY ROM:     Active  Left/Right 07/08/2024   Hip flexion 75/75   Hip extension    Hip abduction    Hip adduction    Hip internal rotation    Hip external rotation    Knee flexion    Knee extension    Ankle dorsiflexion    Ankle plantarflexion    Ankle inversion    Ankle eversion     (Blank rows = not tested)  STRENGTH:  Not objectively assessed at evaluation secondary to patient arriving late and needing time for education and HEP.  MMT Left/Right    Hip flexion    Hip extension    Hip abduction    Hip adduction    Hip internal rotation    Hip external rotation    Knee flexion    Knee extension    Ankle dorsiflexion    Ankle plantarflexion    Ankle inversion    Ankle eversion     (Blank rows = not tested)  GAIT: Distance walked: 50 feet Assistive device utilized: None Level of assistance: Complete Independence Comments: Aris notes his walking is limited to no more than 5 minutes due to increasing low back pain  TREATMENT DATE: 07/29/24***     07/08/2024 Lumbar extension active range of motion 10 x 3 seconds, hands low on gluteals and hips forward Supine hamstrings stretch with the opposite leg straight 4 x 20 seconds Single knee-to-chest stretch with the opposite leg straight 4 x 20 seconds  02464: Reviewed imaging with spine model; reviewed examination findings and day 1 home exercises  PATIENT EDUCATION:  Education details: See above Person educated: Patient Education method: Explanation, Demonstration, Tactile cues, Verbal cues, and Handouts Education comprehension: verbalized understanding, returned demonstration, verbal cues required, tactile cues required, and needs further education  HOME EXERCISE  PROGRAM: Access Code: [email or web address] URL: [email or web address] Date: 07/08/2024 Prepared by: Lamar Ivory  Exercises - Standing Lumbar Extension at Wall - Forearms  - 5 x daily - 7 x weekly - 1 sets - 5 reps - 3 seconds hold - Single Knee to Chest Stretch  - 2-3 x daily - 7 x weekly - 1 sets - 5 reps - 20 seconds hold - Supine Hamstring Stretch  - 2-3 x daily - 7 x weekly - 1 sets - 5 reps - 20 seconds hold  ASSESSMENT:  CLINICAL IMPRESSION: ***Patient is a 78 y.o. male who was seen today for physical therapy evaluation and treatment for  M48.062 (ICD-10-CM) - Spinal stenosis, lumbar region, with neurogenic claudication  .  Park has a very flexed posture, has tight hip musculature contributing to his flexed posture in standing and walking, has limited endurance with standing and walking due to increasing low back pain and right lower extremity radiculopathy.  He will benefit from supervised physical therapy to improve his standing posture, practical body mechanics, bilateral lower extremity flexibility (particularly hip flexors and hamstrings) and low back strength to improve his weight-bearing function.  Cadell also  Consider Epley as Undray suffers from vertigo which may affect his participation with some activities.  OBJECTIVE IMPAIRMENTS: Abnormal gait, decreased activity tolerance, decreased endurance, decreased knowledge of condition, difficulty walking, decreased ROM, decreased strength, decreased safety awareness, impaired perceived functional ability, increased muscle spasms, impaired flexibility, improper body mechanics, postural dysfunction, obesity, and pain.   ACTIVITY LIMITATIONS: carrying, lifting, bending, standing, squatting, sleeping, stairs, bed mobility, and locomotion level  PARTICIPATION LIMITATIONS: meal prep, cleaning, shopping, community activity, and occupation  PERSONAL FACTORS: Colon cancer, Type 2 DM, HTN, 2 previous low back surgeries (laminectomy  L4-5 and L5-S1), spinal stenosis, lumbar OA/DDD/DJD are also affecting patient's functional outcome.   REHAB POTENTIAL: Good  CLINICAL DECISION MAKING: Evolving/moderate complexity  EVALUATION COMPLEXITY: Moderate   GOALS: Goals reviewed with patient? Yes  SHORT TERM GOALS: Target date: 08/05/2024  Helios will be independent in his day 1 home exercise program Baseline: Started 07/08/2024 Goal status: INITIAL  2.  Improve lumbar extension AROM to at least 5 degrees Baseline: 0 degrees Goal status: INITIAL  3.  Improve bilateral lower extremity flexibility for hip flexors to 90 degrees and hamstrings to 40 degrees Baseline: 75 and 30 respectively Goal status: INITIAL   LONG TERM GOALS: Target date: 09/07/2024  Improve patient-specific functional score to at least 5 Baseline: 2 Goal status: INITIAL  2.  Kollyn will report low back and right lower extremity radicular pain no higher than 4/10 on the visual analog scale Baseline: Can be 10/10 Goal status: INITIAL  3.  Improve lumbar extension AROM to at least 10 degrees Baseline: 0 degrees Goal status: INITIAL  4.  Kenlee will report being able to stand for up to 20 minutes without being stopped by increasing low back pain Baseline: 10 minutes Goal status: INITIAL  5.  Cinch will be able to walk for up to 10 minutes without being stopped by increasing low back pain Baseline: No more than 5 minutes Goal status: INITIAL  6.  Destan will be independent with his long-term maintenance home exercise program at discharge Baseline: Started 07/08/2024 Goal status: INITIAL  PLAN:  PT  FREQUENCY: 1-2x/week  PT DURATION: 8 weeks  PLANNED INTERVENTIONS: 97110-Therapeutic exercises, 97530- Therapeutic activity, [id number]- Neuromuscular re-education, 97535- Self Care, [id number]- Manual therapy, [id number]- Gait training, [phone]- Traction (mechanical), [phone] (1-2 muscles), [id number] (3+ muscles)- Dry Needling, Patient/Family education, Balance  training, Stair training, Spinal mobilization, Cryotherapy, and Moist heat.  PLAN FOR NEXT SESSION: ***Review day 1 home exercises.  Special emphasis on posture and body mechanics awareness along with flexibility for hip flexors, hamstrings and strengthening for scapular retractors and low back to improve standing and walking endurance.   **20 PT VISITS APPROVED 11/26-09/16/24 **  Burnard Meth, PT 07/29/2024  2:56 PM

## 2024-07-30 ENCOUNTER — Telehealth: Payer: Self-pay

## 2024-07-30 ENCOUNTER — Encounter

## 2024-07-30 NOTE — Telephone Encounter (Signed)
 Called and spoke with patient about no show hx and told him future appointment will be cancelled.  Pt understood.   Told to call if wants to continue.

## 2024-08-04 ENCOUNTER — Encounter: Payer: Self-pay | Admitting: Physical Medicine & Rehabilitation

## 2024-08-05 ENCOUNTER — Encounter: Admitting: Rehabilitative and Restorative Service Providers"

## 2024-09-15 ENCOUNTER — Encounter: Admitting: Physical Medicine & Rehabilitation
